# Patient Record
Sex: Female | Born: 1960
Health system: Southern US, Community
[De-identification: ages and names within clinical notes are randomized; demographics above are authoritative.]

## PROBLEM LIST (undated history)

## (undated) DIAGNOSIS — E7800 Pure hypercholesterolemia, unspecified: Secondary | ICD-10-CM

## (undated) DIAGNOSIS — E119 Type 2 diabetes mellitus without complications: Secondary | ICD-10-CM

## (undated) HISTORY — PX: DILATION AND CURETTAGE OF UTERUS: SHX78

## (undated) HISTORY — DX: Pure hypercholesterolemia, unspecified: E78.00

## (undated) HISTORY — DX: Type 2 diabetes mellitus without complications: E11.9

## (undated) HISTORY — PX: APPENDECTOMY: SHX54

---

## 1998-08-18 ENCOUNTER — Emergency Department (HOSPITAL_COMMUNITY): Admission: EM | Admit: 1998-08-18 | Discharge: 1998-08-19 | Payer: Self-pay | Admitting: Emergency Medicine

## 1998-08-18 ENCOUNTER — Encounter: Payer: Self-pay | Admitting: General Surgery

## 2000-12-30 ENCOUNTER — Other Ambulatory Visit: Admission: RE | Admit: 2000-12-30 | Discharge: 2000-12-30 | Payer: Self-pay | Admitting: Obstetrics and Gynecology

## 2002-01-05 ENCOUNTER — Other Ambulatory Visit: Admission: RE | Admit: 2002-01-05 | Discharge: 2002-01-05 | Payer: Self-pay | Admitting: Obstetrics and Gynecology

## 2003-01-25 ENCOUNTER — Other Ambulatory Visit: Admission: RE | Admit: 2003-01-25 | Discharge: 2003-01-25 | Payer: Self-pay | Admitting: Obstetrics and Gynecology

## 2003-02-21 ENCOUNTER — Ambulatory Visit (HOSPITAL_COMMUNITY): Admission: RE | Admit: 2003-02-21 | Discharge: 2003-02-21 | Payer: Self-pay | Admitting: Obstetrics and Gynecology

## 2003-02-21 ENCOUNTER — Encounter (INDEPENDENT_AMBULATORY_CARE_PROVIDER_SITE_OTHER): Payer: Self-pay

## 2004-02-02 ENCOUNTER — Other Ambulatory Visit: Admission: RE | Admit: 2004-02-02 | Discharge: 2004-02-02 | Payer: Self-pay | Admitting: Obstetrics and Gynecology

## 2005-01-30 ENCOUNTER — Other Ambulatory Visit: Admission: RE | Admit: 2005-01-30 | Discharge: 2005-01-30 | Payer: Self-pay | Admitting: Obstetrics and Gynecology

## 2005-05-31 ENCOUNTER — Encounter: Admission: RE | Admit: 2005-05-31 | Discharge: 2005-05-31 | Payer: Self-pay | Admitting: Obstetrics and Gynecology

## 2005-07-16 ENCOUNTER — Encounter: Admission: RE | Admit: 2005-07-16 | Discharge: 2005-07-16 | Payer: Self-pay | Admitting: Obstetrics and Gynecology

## 2006-01-28 ENCOUNTER — Encounter: Admission: RE | Admit: 2006-01-28 | Discharge: 2006-01-28 | Payer: Self-pay | Admitting: Obstetrics and Gynecology

## 2006-02-19 ENCOUNTER — Other Ambulatory Visit: Admission: RE | Admit: 2006-02-19 | Discharge: 2006-02-19 | Payer: Self-pay | Admitting: Obstetrics and Gynecology

## 2007-02-24 ENCOUNTER — Other Ambulatory Visit: Admission: RE | Admit: 2007-02-24 | Discharge: 2007-02-24 | Payer: Self-pay | Admitting: Obstetrics and Gynecology

## 2008-05-12 ENCOUNTER — Observation Stay (HOSPITAL_COMMUNITY): Admission: RE | Admit: 2008-05-12 | Discharge: 2008-05-13 | Payer: Self-pay | Admitting: General Surgery

## 2008-05-12 ENCOUNTER — Encounter (INDEPENDENT_AMBULATORY_CARE_PROVIDER_SITE_OTHER): Payer: Self-pay | Admitting: General Surgery

## 2008-05-12 ENCOUNTER — Ambulatory Visit (HOSPITAL_COMMUNITY): Admission: RE | Admit: 2008-05-12 | Discharge: 2008-05-12 | Payer: Self-pay | Admitting: Family Medicine

## 2010-07-01 ENCOUNTER — Encounter: Payer: Self-pay | Admitting: Obstetrics and Gynecology

## 2010-07-27 IMAGING — CT CT ABDOMEN WO/W CM
2 of 4 series · 13 of 32 positions shown, 18 images · IV contrast (Omnipaque 300)
Comparison: None

CT ABDOMEN

CLINICAL DATA: Right lower quadrant pain.  Nausea, vomiting.
Hematuria.  Evaluate for stones and appendicitis.

CT ABDOMEN AND PELVIS WITHOUT AND WITH CONTRAST
TECHNIQUE: Multidetector CT imaging of the abdomen and pelvis was
performed following the standard protocol before and following the
bolus administration of intravenous contrast.
Contrast: 100 ml 1mnipaque-F22

[Series 3: abd_pel_with 5.0 b40f · axial · 0.68mm/px · z∈[-415,-75]mm · 8 of 88 slices shown, 13 images]
[im 10/88  soft-tissue]
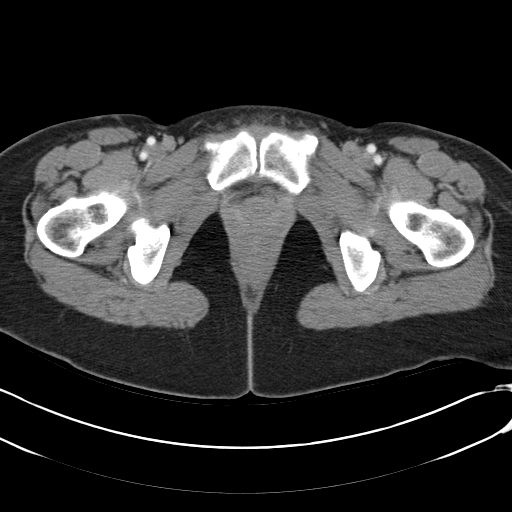
[im 10/88  bone]
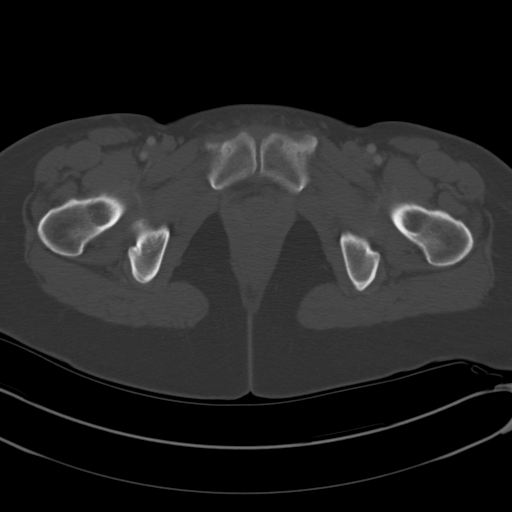
[im 20/88  soft-tissue]
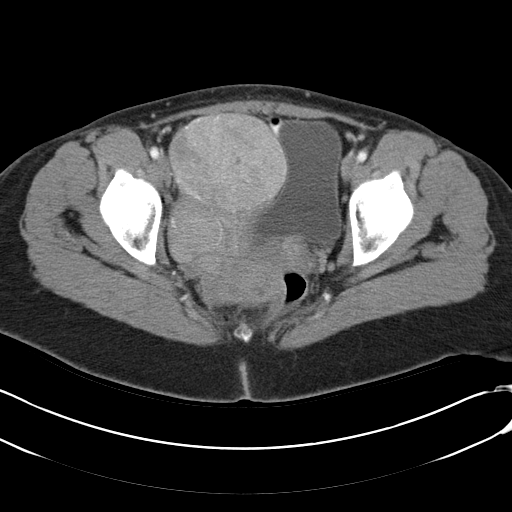
[im 30/88  soft-tissue]
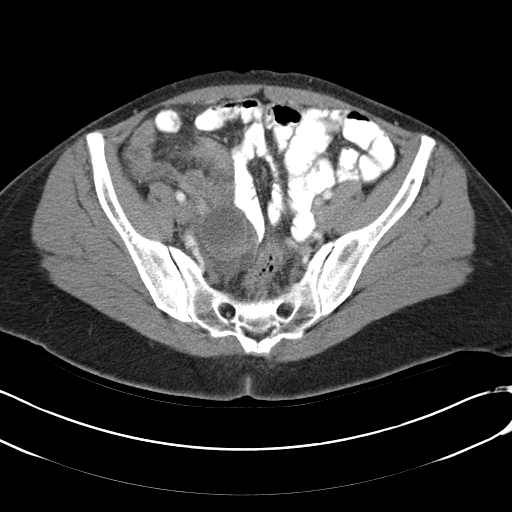
[im 39/88  soft-tissue]
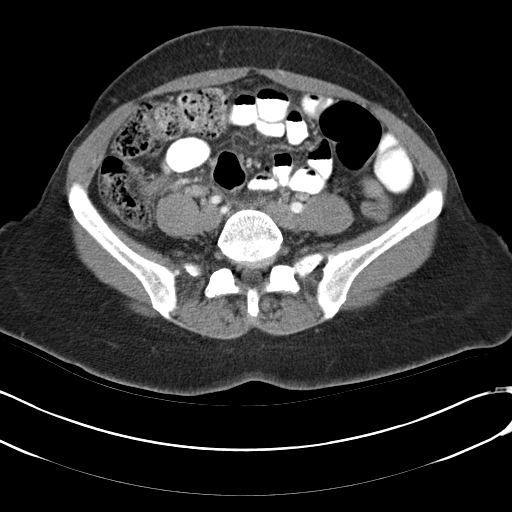
[im 49/88  soft-tissue]
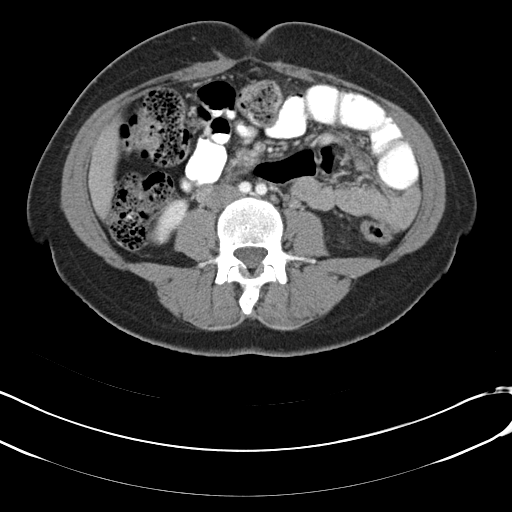
[im 49/88  lung]
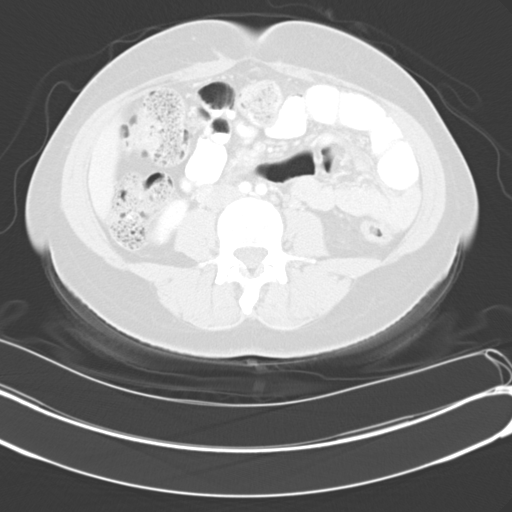
[im 59/88  soft-tissue]
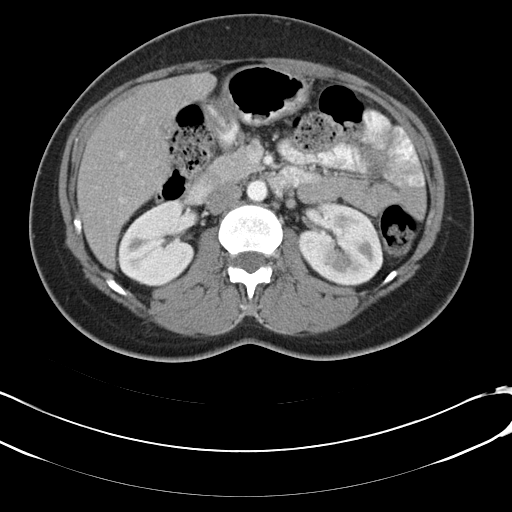
[im 59/88  lung]
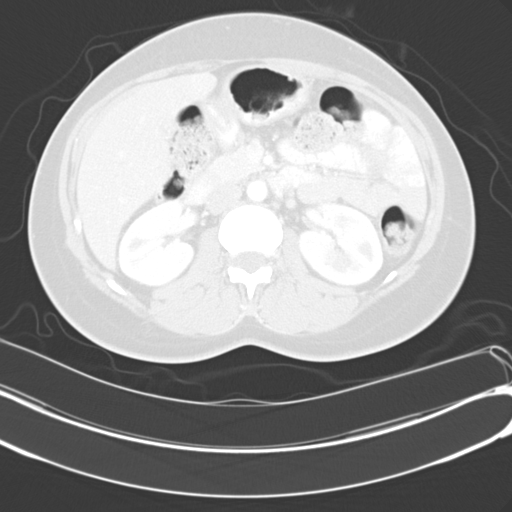
[im 68/88  soft-tissue]
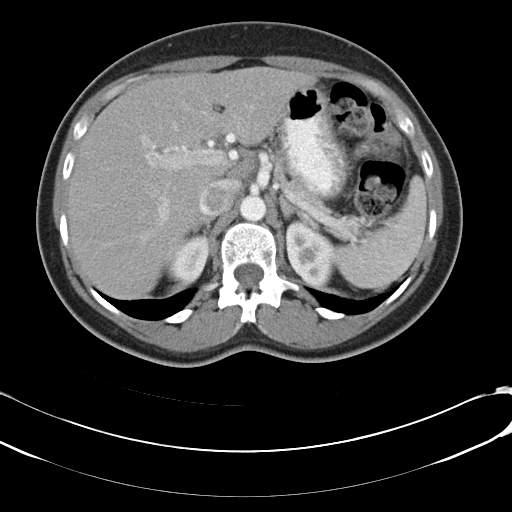
[im 68/88  lung]
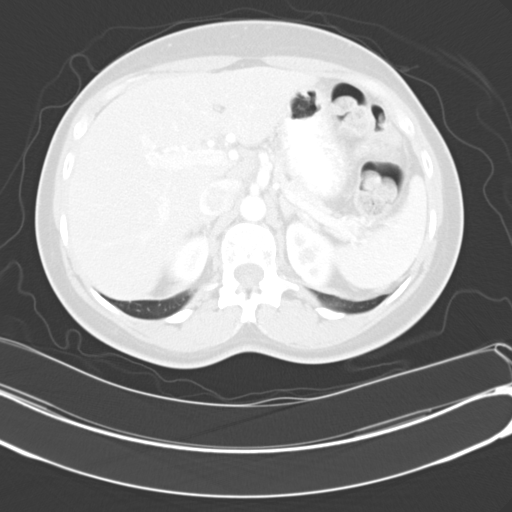
[im 78/88  soft-tissue]
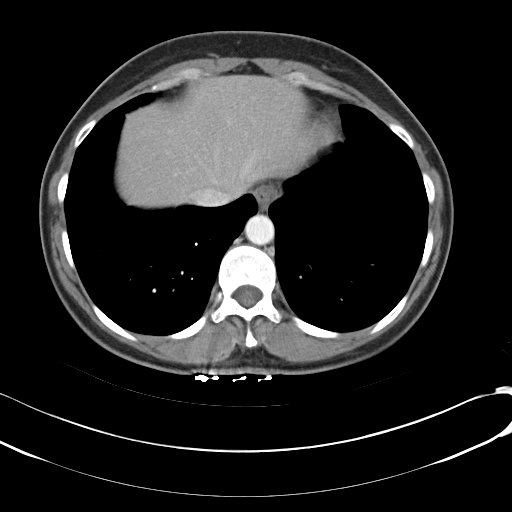
[im 78/88  lung]
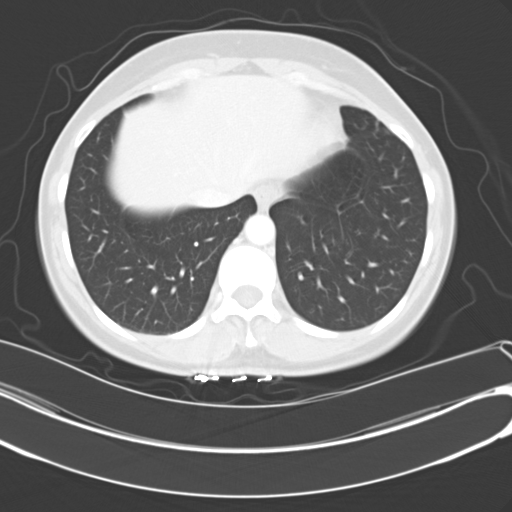

[Series 7: kidney delay 5.0 b40f · axial · delayed · 0.68mm/px · z∈[-415,-220]mm · 5 of 88 slices shown]
[im 10/88  soft-tissue]
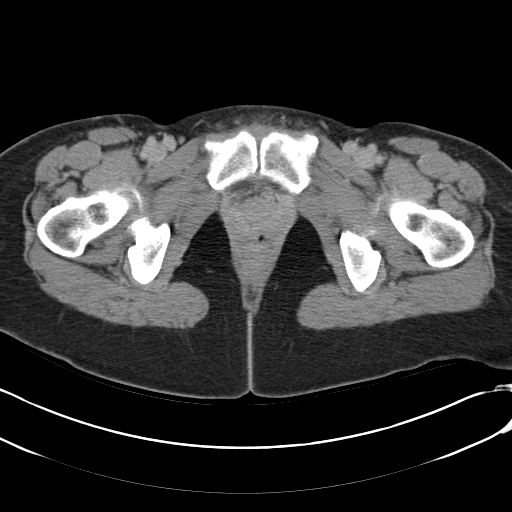
[im 20/88  soft-tissue]
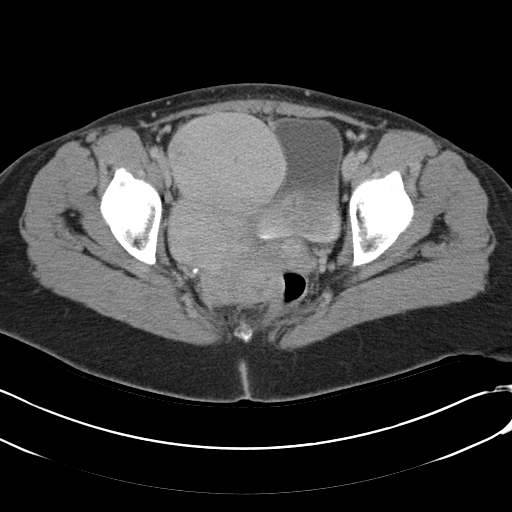
[im 30/88  soft-tissue]
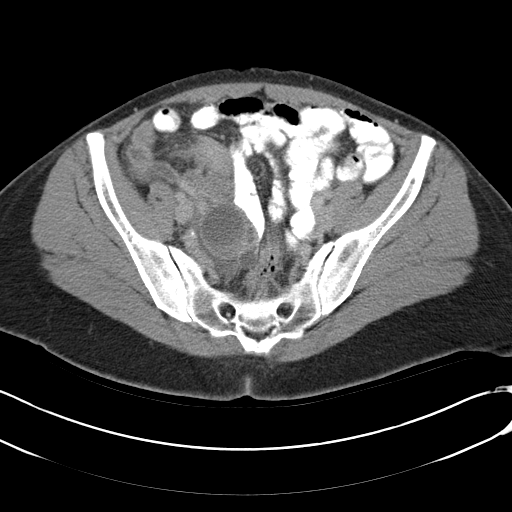
[im 39/88  soft-tissue]
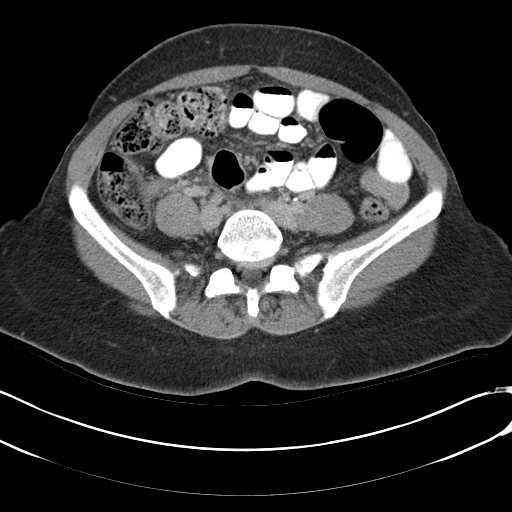
[im 49/88  soft-tissue]
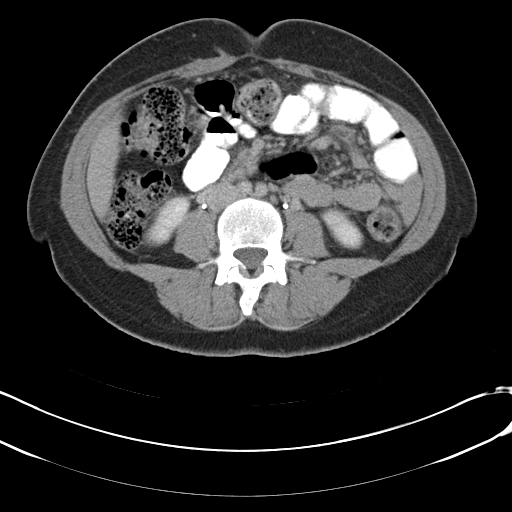

[13 of 32 positions shown; findings below may reference images not displayed]

FINDINGS: Images of the lung bases are unremarkable.  No intra
renal or ureteral calculi are identified.

Within the left hepatic lobe, a small cyst is 9 mm in diameter.  No
focal abnormalities identified within the spleen, pancreas, adrenal
glands, or kidneys.  Numerous calcified gallstones are identified
within a contracted gallbladder.
IMPRESSION: No evidence for acute abnormality in the abdomen.  See below for
description of the pelvis.

CT PELVIS
FINDINGS: Within the right lower quadrant, there is mesenteric
stranding.  The appendix appears thickened but is difficult to
differentiate from surrounding bowel loops.  Appendix likely
measures 11 mm on image 56.  Within the right lower quadrant, there
is a cystic structure measuring 3.8 cm on image 59.  I would favor
this representing an ovarian process.  Solid right adnexal and
uterine masses are 8.3 and 3.9 cm, likely representing a uterine
fibroids.  Given the complexity of the right lower quadrant
anatomy, the cause of the patient's pain is difficult to pinpoint.
The findings are suspicious for appendicitis however.  No left
adnexal mass is identified.  There is no pelvic adenopathy. Small
amount free pelvic fluid is present.
IMPRESSION: 1.  Right lower quadrant stranding and probable appendiceal
thickening.  Findings are suspicious for acute appendicitis.
2.  Uterine fibroids.
3.  Probable right ovarian cyst, 3.8 cm.

Critical test results telephoned to Edvig in the office of  Dr.
Dutch  at the time of interpretation on 05/12/2008 at [DATE] p.m.

## 2010-09-04 ENCOUNTER — Other Ambulatory Visit (HOSPITAL_COMMUNITY): Payer: Self-pay | Admitting: Internal Medicine

## 2010-09-04 DIAGNOSIS — Z139 Encounter for screening, unspecified: Secondary | ICD-10-CM

## 2010-09-06 ENCOUNTER — Ambulatory Visit (HOSPITAL_COMMUNITY)
Admission: RE | Admit: 2010-09-06 | Discharge: 2010-09-06 | Disposition: A | Discharge status: Home / Self Care | Payer: Self-pay | Source: Ambulatory Visit | Attending: Internal Medicine | Admitting: Internal Medicine

## 2010-09-06 DIAGNOSIS — Z1231 Encounter for screening mammogram for malignant neoplasm of breast: Secondary | ICD-10-CM | POA: Insufficient documentation

## 2010-09-06 DIAGNOSIS — Z139 Encounter for screening, unspecified: Secondary | ICD-10-CM

## 2010-10-23 NOTE — Op Note (Signed)
NAME:  Vicki Ward, Vicki Ward NO.:  [id number]   MEDICAL RECORD NO.:  [id number]          PATIENT TYPE:  OBV   LOCATION:  A334                          FACILITY:  [facility]   PHYSICIAN:  Tilford Pillar, MD      DATE OF BIRTH:  1960-10-26   DATE OF PROCEDURE:  05/12/2008  DATE OF DISCHARGE:                               OPERATIVE REPORT   PREOPERATIVE DIAGNOSIS:  Acute appendicitis.   POSTOPERATIVE DIAGNOSIS:  Acute appendicitis.   PROCEDURE:  Laparoscopic appendectomy.   SURGEON:  Tilford Pillar, MD.   ANESTHESIA:  General endotracheal local anesthetic with 0.5% Sensorcaine  plain.   SPECIMENS:  Appendix.   ESTIMATED BLOOD LOSS:  Minimal.   INDICATIONS:  The patient is a 50 year old female who is referred to my  office by her primary care physician for right lower quadrant abdominal  pain.  She had actually had extensive workup this morning by her primary  care physician with a CT of the abdomen and pelvis and blood work  already obtained.  Her symptoms have started approximately 2 days ago as  a periumbilical pain, with localization to right lower quadrant.  Her  physical exam and medical history were consistent with acute  appendicitis.  This was confirmed with a CT evaluation of the abdomen,  which demonstrated inflammatory changes around the appendix as well as  elevated white blood cell count.  She was therefore directly admitted to  [facility].  Risks, benefits, and alternatives of a  laparoscopic possible open appendectomy were discussed at length with  the patient.  The patient's questions and concerns were addressed and  the patient was consented for the planned procedure.   OPERATION:  The patient was taken to the operating room, placed in  supine position on the operating room table at which time the general  anesthetic was administered.  Once the patient was asleep, she was  endotracheally intubated by Anesthesia.  At this point, a Foley  catheter  was placed in a standard sterile fashion by the operating room staff and  her abdomen was prepped with DuraPrep solution.  Drapes were placed in  standard fashion.  A stab incision was created supraumbilically with 11-  blade scalpel.  Additional dissection down through the subcuticular  tissue was carried out using a Kocher clamp, which was utilized to grasp  the anterior abdominal wall fascia and move this anteriorly.  A Veress  needle was then inserted.  Saline drop test was utilized to confirm  intraperitoneal placement and then pneumoperitoneum was then initiated.  Once sufficient pneumoperitoneum was obtained, a 11-mm trocar was  inserted over the laparoscope allowing visualization of the trocar  entering into the peritoneal cavity.  At this time, the remaining  trocars were placed with a 5-mm trocar in the suprapubic region and an  11-mm trocar in the left lateral abdominal wall.  These were placed via  a stab incision and placement under direct visualization.  The patient  was then placed in a Trendelenburg left lateral decubitus position to  help with the exposure.  The  cecum was identified, __________ followed  down to the appendix which was clearly dilated and indurated.  This was  freed from surrounding tissue.  A [place] dissector was utilized to  create a window between the appendix and mesoappendix at the base of the  appendix that join the cecum.  A regular 35-mm Endo-GIA stapler was  utilized to divide the base of the appendix at the cecum and then 2  vascular reloads were utilized to divide the mesoappendix.  Once freed,  the appendix was placed in EndoCatch bag and was placed along the right  lateral abdominal wall.   At this point I examined the staple lines.  Hemostasis was excellent.  All staple lines were intact and I was quite pleased with the appearance  of the area.  Attention at this time was turned to closure.  Using a  Endoclose suture passing  device, a 2-0 Vicryl suture was passed through  both 11-mm trocar sites.  The appendix was then removed in an intact  EndoCatch bag through the umbilical trocar site.  The appendix was  placed in the back table and was sent as a permanent specimen to  pathology.  At this time, the pneumoperitoneum was evacuated.  The  Vicryl sutures were secured.  The local anesthetic was instilled.  A 4-0  Monocryl was utilized to reapproximate the skin edges at all 3 trocar  sites in a running subcuticular suture.  The skin was washed and dried  with moist and dry towel.  Benzoin was applied around the incision.  Half-inch Steri-Strips were placed.  The drains were removed.  The  patient was allowed to come out of general anesthetic and the patient  was transferred back to regular hospital bed.  She was transferred to  the postanesthetic care unit in stable condition.  At the conclusion of  the procedure, all instrument, sponge, and needle counts were correct.  The patient tolerated the procedure well.      Tilford Pillar, MD  Electronically Signed     BZ/MEDQ  D:  05/12/2008  T:  05/13/2008  Job:  [id number]   cc:   [facility]   Patrica Duel, M.D.  Fax: [phone]

## 2010-10-23 NOTE — H&P (Signed)
NAME:  Vicki Ward, Vicki Ward NO.:  [id number]   MEDICAL RECORD NO.:  [id number]          PATIENT TYPE:  OBV   LOCATION:  A334                          FACILITY:  [facility]   PHYSICIAN:  Tilford Pillar, MD      DATE OF BIRTH:  1961-01-25   DATE OF ADMISSION:  05/12/2008  DATE OF DISCHARGE:  LH                              HISTORY & PHYSICAL   ADMISSION DIAGNOSIS:  Acute appendicitis.   HISTORY OF PRESENT ILLNESS:  The patient is a 50 year old female, who  presented to my office by referral from her primary care physician for  right lower quadrant abdominal pain.  This apparently started  approximately 24-48 hours previously.  It started as a periumbilical  abdominal pain within the last 12 hours.  It is localized to the right  lower quadrant.  Pain is being constant and severe in the right lower  quadrant.  She has had no nausea, no emesis, no change in bowel  function, no hematochezia, no melena, and no change in urination.  She  has had no similar symptomatology in the past, and it is worse with  movement.  Based on the pain, she did present to Dr. Nobie Putnam, her  primary care physician.  Workup was obtained this morning including a CT  evaluation of the abdomen and pelvis as an outpatient as well as blood  rotation was then reevaluated at [facility], and the patient  was referred to my office.  At this time, the pain is continuous.  She  has had no sick contact or no recent exposures.   PAST MEDICAL HISTORY:  Diabetes mellitus type 2.   PAST SURGICAL HISTORY:  None.   MEDICATIONS:  Actos 15 mg p.o. daily.   ALLERGIES:  No known drug allergies.   SOCIAL HISTORY:  No tobacco.  She has had 2-3 beers per week,  utilization of alcohol, and no recreational drug use.  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   REVIEW OF SYSTEMS:  CONSTITUTIONAL:  Unremarkable.  EYES:  Unremarkable.  EARS, NOSE, AND THROAT:  Unremarkable.  RESPIRATORY:  Unremarkable.  CARDIOVASCULAR:  Unremarkable.  GASTROINTESTINAL:  As per HPI.  GENITOURINARY:  Unremarkable.  MUSCULOSKELETAL:  Unremarkable.  ENDOCRINE:  Unremarkable.  NEURO:  Unremarkable.  SKIN:  Unremarkable.   PHYSICAL EXAMINATION:  GENERAL:  The patient is an age appropriate  female, in no acute distress.  She is alert and oriented x3, again not  in any acute distress.  HEENT:  Scalp:  No deformities, no masses.  Pupils equal, round, and  reactive to light.  Extraocular movements are intact.  No scleral  icterus or conjunctival pallor is noted.  Oral mucosa is pink.  Normal  occlusion.  NECK:  Trachea is midline.  No cervical lymphadenopathy is apparent.  PULMONARY:  Unlabored respirations.  She is clear to auscultation  bilaterally.  No crackles.  No wheezing.  CARDIOVASCULAR:  Regular rate and rhythm.  No murmurs and no gallops are  appreciated in the office.  She has 2+  radial and dorsalis pedis pulses  bilaterally.  ABDOMINAL:  Abdomen is soft and flat.  She does have positive right  lower quadrant pain at this point.  She does have referred pain to this  area as well.  She has no masses.  She has no diffuse peritoneal signs.  No hernias are appreciated.  SKIN:  Warm and dry.   PERTINENT LABORATORY AND RADIOGRAPHIC STUDIES:  CBC, white blood cell  count 11,000.  CT of the abdomen and pelvis that shows no evidence of  any free air or free fluid.  There is a noted right ovarian cyst.  There  is also a pericecal and periappendiceal fat stranding as well as  dilatation of the appendix consistent with an acute appendicitis as per  radiology report.   ASSESSMENT AND PLAN:  Acute appendicitis.  Based on the patient's  physical exam, the history of present illness as well as of the  radiographic, and laboratory evaluation, I did discuss with the patient  the suspicion of diagnosis of acute appendicitis.  At this time, I do  recommend proceeding to the operating  room.  Risks, benefits, and  alternatives are discussed at length with the patient including, but not  limited to risk of bleeding, infection, staple line leak as well as a  possibility of intraoperative cardiac and pulmonary events.  At this  time, the patient will be kept in n.p.o. status.  IV fluids will be  administered upon presentation to a short-stay surgery.  The patient  understands that she will after observation will resume diabetic  management following her operation.  Perioperative antibiotics will be  administered.  Deep vein thrombosis prophylaxis will be maintained with  Lovenox subcutaneous injection,  bilateral thigh high TedHose, and  bilateral SCDs.      Tilford Pillar, MD  Electronically Signed     BZ/MEDQ  D:  05/12/2008  T:  05/13/2008  Job:  [id number]

## 2010-10-26 NOTE — H&P (Signed)
   NAME:  Vicki Ward, Vicki Ward                            ACCOUNT NO.:  [id number]   MEDICAL RECORD NO.:  [id number]                   PATIENT TYPE:  AMB   LOCATION:  SDC                                  FACILITY:  [facility]   PHYSICIAN:  James A. Ashley Royalty, M.D.             DATE OF BIRTH:  1960-11-04   DATE OF ADMISSION:  DATE OF DISCHARGE:                                HISTORY & PHYSICAL   HISTORY OF PRESENT ILLNESS:  This is a 50 year old gravida 1 para 1 with a  known fibroid uterus who presented to me January 25, 2003 complaining of  intermenstrual bleeding of two months duration.  She has recently been on  Ortho Tri-Cyclen but ran out last month.  She is for diagnostic/operative  hysteroscopy and dilatation and curettage.   MEDICATIONS:  As above.   PAST MEDICAL HISTORY:  1. Medical:  Negative.  2. Surgical:  Negative.   ALLERGIES:  Negative.   FAMILY HISTORY:  Positive for diabetes.   SOCIAL HISTORY:  The patient denies using tobacco or significant alcohol.   REVIEW OF SYSTEMS:  Noncontributory.   PHYSICAL EXAMINATION:  GENERAL:  Well-developed, well-nourished, pleasant  black female in no acute distress.  VITAL SIGNS:  Afebrile, vital signs stable.  SKIN:  Warm and dry without lesions.  LYMPH:  There is no supraclavicular, cervical, or inguina adenopathy.  HEENT:  Normocephalic.  NECK:  Supple without thyromegaly.  CHEST:  Lungs are clear.  CARDIAC:  Regular rate and rhythm without murmurs, gallops, or rubs.  BREASTS:  Soft and without palpable mass, discharge, retraction, or  adenopathy.  ABDOMEN:  Soft and nontender without masses or organomegaly.  Bowel sounds  are active.  MUSCULOSKELETAL:  Reveals full range of motion without edema, cyanosis, or  CVA tenderness.  PELVIC:  External genitalia is within normal limits.  The vagina and cervix  are without gross lesions.  Bimanual examination revealed the uterus to be  approximately 9 x 5 x 4 cm, slightly nodular.  I could  appreciate no adnexal  masses.   IMPRESSION:  1. Fibroid uterus.  2. Abnormal uterine bleeding.   PLAN:  1. Diagnostic/operative hysteroscopy.  2. Dilatation and curettage.   The risks, benefits, complications, and alternatives were fully discussed  with the patient.  She states she understands and accepts.  Questions  invited and answered.                                               James A. Ashley Royalty, M.D.    JAM/MEDQ  D:  02/21/2003  T:  02/21/2003  Job:  [id number]   cc:   Outpatient Surgery at 12:30 p.m.

## 2010-10-26 NOTE — Op Note (Signed)
NAME:  Vicki Ward, Vicki Ward                            ACCOUNT NO.:  [id number]   MEDICAL RECORD NO.:  [id number]                   PATIENT TYPE:  AMB   LOCATION:  SDC                                  FACILITY:  [facility]   PHYSICIAN:  James A. Ashley Royalty, M.D.             DATE OF BIRTH:  February 03, 1961   DATE OF PROCEDURE:  02/21/2003  DATE OF DISCHARGE:                                 OPERATIVE REPORT   PREOPERATIVE DIAGNOSES:  1. Abnormal uterine bleeding.  2. Fibroid uterus.   POSTOPERATIVE DIAGNOSES:  1. Abnormal uterine bleeding.  2. Possible endometrial polyp.  Pathology pending.   PROCEDURE:  1. Diagnostic/operative hysteroscopy with polypectomy.  2. Dilatation and curettage.   SURGEON:  Rudy Jew. Ashley Royalty, M.D.   ANESTHESIA:  General anesthesia.   ESTIMATED BLOOD LOSS:  Less than 25 mL.   COMPLICATIONS:  None.   PACKS AND DRAINS:  None.   DESCRIPTION OF PROCEDURE:  The patient was taken to the operating room and  placed in the dorsal supine position.  After adequate general anesthesia was  administered, she was placed in the lithotomy position, prepped and draped  in the usual manner for vaginal surgery.  A posterior weighted retractor was  placed in the vagina.  The anterior lip of the cervix was grasped with a  single-tooth tenaculum.  The uterus was gently sounded to two and three  quarters inches and noted to be anteverted.  The cervix was then serially  dilated to a size 29 [place] using [employer] dilators.  The diagnostic  hysteroscope was placed inside the uterine cavity and visualization was  noted to be poor.  Hence a resectoscope was placed.  Sorbitol was used as a  distention median throughout.  The left and right tubal ostia were  visualized without difficulty.  There appeared to be an anterior polyp  emanating from the anterior aspect of the uterine fundus.  This was excised  sharply and submitted to pathology for histologic studies.  The operator  could not be sure  whether it was in fact a polyp or perhaps some partially  disrupted fragment of endometrium.  At any rate, it was submitted separately  to pathology for histologic studies.   Next, attention was turned to the uterine curettage.  A medium sized curette  was introduced into the uterine cavity.  A four quadrant technique was  employed in order to obtain diagnostic curettage.  Then a therapeutic  curettage was performed.  The curettings were submitted in their entirety to  pathology for histologic studies separately.   At this point, the patient was felt to have benefitted maximally from the  surgical procedure.  The vaginal instruments were removed.  Small cervical  lacerations on the tenaculum were easily repaired with 3-0 chromic in  interrupted fashion.  Hemostasis was noted.  The patient was then taken to  the recovery room in excellent condition.  James A. Ashley Royalty, M.D.    JAM/MEDQ  D:  02/21/2003  T:  02/21/2003  Job:  [id number]

## 2011-03-15 LAB — BASIC METABOLIC PANEL
Chloride: 101 mEq/L (ref 96–112)
Potassium: 3.9 mEq/L (ref 3.5–5.1)

## 2011-03-15 LAB — CBC
HCT: 38.8 % (ref 36.0–46.0)
Hemoglobin: 13.1 g/dL (ref 12.0–15.0)
Platelets: 287 10*3/uL (ref 150–400)
RBC: 4.12 MIL/uL (ref 3.87–5.11)
WBC: 11 10*3/uL — ABNORMAL HIGH (ref 4.0–10.5)

## 2011-03-15 LAB — GLUCOSE, CAPILLARY
Glucose-Capillary: 165 mg/dL — ABNORMAL HIGH (ref 70–99)
Glucose-Capillary: 177 mg/dL — ABNORMAL HIGH (ref 70–99)

## 2011-03-25 ENCOUNTER — Ambulatory Visit (HOSPITAL_COMMUNITY)
Admission: RE | Admit: 2011-03-25 | Discharge: 2011-03-25 | Disposition: A | Discharge status: Home / Self Care | Payer: 59 | Source: Ambulatory Visit | Attending: Family Medicine | Admitting: Family Medicine

## 2011-03-25 ENCOUNTER — Other Ambulatory Visit (HOSPITAL_COMMUNITY): Payer: Self-pay | Admitting: Family Medicine

## 2011-03-25 DIAGNOSIS — T148XXA Other injury of unspecified body region, initial encounter: Secondary | ICD-10-CM

## 2011-03-25 DIAGNOSIS — M25579 Pain in unspecified ankle and joints of unspecified foot: Secondary | ICD-10-CM | POA: Insufficient documentation

## 2011-03-25 DIAGNOSIS — E119 Type 2 diabetes mellitus without complications: Secondary | ICD-10-CM

## 2012-07-20 ENCOUNTER — Other Ambulatory Visit (HOSPITAL_COMMUNITY): Payer: Self-pay | Admitting: Internal Medicine

## 2012-07-20 DIAGNOSIS — Z139 Encounter for screening, unspecified: Secondary | ICD-10-CM

## 2012-07-27 ENCOUNTER — Ambulatory Visit (HOSPITAL_COMMUNITY)
Admission: RE | Admit: 2012-07-27 | Discharge: 2012-07-27 | Disposition: A | Discharge status: Home / Self Care | Payer: 59 | Source: Ambulatory Visit | Attending: Internal Medicine | Admitting: Internal Medicine

## 2012-07-27 DIAGNOSIS — Z1231 Encounter for screening mammogram for malignant neoplasm of breast: Secondary | ICD-10-CM | POA: Insufficient documentation

## 2012-07-27 DIAGNOSIS — Z139 Encounter for screening, unspecified: Secondary | ICD-10-CM

## 2012-07-30 ENCOUNTER — Telehealth: Payer: Self-pay

## 2012-07-30 NOTE — Telephone Encounter (Signed)
Pt was referred by Dr. Regino Schultze for screening colonoscopy. Called, could not leave message.

## 2012-08-04 NOTE — Telephone Encounter (Signed)
Called and could not leave a message. Mailing a second letter.

## 2012-08-20 ENCOUNTER — Telehealth: Payer: Self-pay

## 2012-08-20 NOTE — Telephone Encounter (Signed)
Pt called to schedule colonoscopy. Her sister was recently diagnosed with rectal cancer. OV with AS on 09/08/2012 at 2:30 PM.

## 2012-09-08 ENCOUNTER — Ambulatory Visit (INDEPENDENT_AMBULATORY_CARE_PROVIDER_SITE_OTHER): Payer: 59 | Admitting: Gastroenterology

## 2012-09-08 ENCOUNTER — Encounter: Payer: Self-pay | Admitting: Gastroenterology

## 2012-09-08 VITALS — BP 121/68 | HR 79 | Temp 98.3°F | Ht 67.5 in | Wt 154.4 lb

## 2012-09-08 DIAGNOSIS — Z800 Family history of malignant neoplasm of digestive organs: Secondary | ICD-10-CM

## 2012-09-08 HISTORY — DX: Family history of malignant neoplasm of digestive organs: Z80.0

## 2012-09-08 MED ORDER — PEG 3350-KCL-NA BICARB-NACL 420 G PO SOLR: 4000.0000 mL | ORAL | Status: DC

## 2012-09-08 NOTE — Progress Notes (Signed)
CC PCP 

## 2012-09-08 NOTE — Patient Instructions (Addendum)
We have set you up for a colonoscopy with Dr. Jena Gauss in the near future.   Do not take any diabetes medications the morning of the procedure.

## 2012-09-08 NOTE — Assessment & Plan Note (Signed)
52 year old African-American female with no prior colonoscopy, presenting for initial screening. Notably, her sister was recently diagnosed with rectal cancer in her early 49s. Vicki Ward denies any abdominal pain, rectal bleeding, or change in bowel habits. She has no upper GI symptoms.  Proceed with TCS with Dr. Jena Gauss in near future: the risks, benefits, and alternatives have been discussed with the patient in detail. The patient states understanding and desires to proceed.

## 2012-09-08 NOTE — Progress Notes (Signed)
Primary Care Physician:  FUSCO,LAWRENCE J., MD Primary Gastroenterologist:  Dr. Rourk  Chief Complaint  Patient presents with  . Colonoscopy    HPI:   Vicki Ward is a 52-year-old female who presents today at the request of Dr. McGough for screening colonoscopy. Denies any constipation, diarrhea, rectal bleeding. No lack of appetite, thinks she may have lost some weight due to a busy work schedule. Occasional reflux but rare. No dysphagia. Her sister was recently diagnosed with rectal cancer.   Past Medical History  Diagnosis Date  . Diabetes   . Hypercholesteremia     Past Surgical History  Procedure Laterality Date  . Appendectomy    . Dilation and curettage of uterus      Current Outpatient Prescriptions  Medication Sig Dispense Refill  . aspirin 81 MG tablet Take 81 mg by mouth daily.      . Calcium Carbonate-Vitamin D (CALCIUM + D PO) Take 600 mg by mouth daily.      . enalapril (VASOTEC) 5 MG tablet Take 5 mg by mouth daily.      . linagliptin (TRADJENTA) 5 MG TABS tablet Take 5 mg by mouth daily.      . metformin (FORTAMET) 1000 MG (OSM) 24 hr tablet Take 1,000 mg by mouth daily with breakfast.      . simvastatin (ZOCOR) 20 MG tablet Take 20 mg by mouth every evening.       No current facility-administered medications for this visit.    Allergies as of 09/08/2012  . (No Known Allergies)    Family History  Problem Relation Age of Onset  . Rectal cancer Sister     age at onset 53, recently diagnosed     History   Social History  . Marital Status: Divorced    Spouse Name: N/A    Number of Children: N/A  . Years of Education: N/A   Occupational History  . [employer]    Social History Main Topics  . Smoking status: Never Smoker   . Smokeless tobacco: Not on file  . Alcohol Use: Yes     Comment: socially  . Drug Use: No  . Sexually Active: Not on file   Other Topics Concern  . Not on file   Social History Narrative  . No  narrative on file    Review of Systems: Gen: SEE HPI CV: Denies chest pain, heart palpitations, peripheral edema, syncope.  Resp: Denies shortness of breath at rest or with exertion. Denies wheezing or cough.  GI: SEE HPI GU : Denies urinary burning, urinary frequency, urinary hesitancy MS: +joint pain Derm: Denies rash, itching, dry skin Psych: Denies depression, anxiety, memory loss, and confusion Heme: Denies bruising, bleeding, and enlarged lymph nodes.  Physical Exam: BP 121/68  Pulse 79  Temp(Src) 98.3 F (36.8 C) (Oral)  Ht 5' 7.5" (1.715 m)  Wt 154 lb 6.4 oz (70.035 kg)  BMI 23.81 kg/m2 General:   Alert and oriented. Pleasant and cooperative. Well-nourished and well-developed. Appears younger than stated age.  Head:  Normocephalic and atraumatic. Eyes:  Without icterus, sclera clear and conjunctiva pink.  Ears:  Normal auditory acuity. Nose:  No deformity, discharge,  or lesions. Mouth:  No deformity or lesions, oral mucosa pink.  Neck:  Supple, without mass or thyromegaly. Lungs:  Clear to auscultation bilaterally. No wheezes, rales, or rhonchi. No distress.  Heart:  S1, S2 present without murmurs appreciated.  Abdomen:  +BS, soft, non-tender and non-distended. No HSM noted.   No guarding or rebound. No masses appreciated.  Rectal:  Deferred  Msk:  Symmetrical without gross deformities. Normal posture. Extremities:  Without clubbing or edema. Neurologic:  Alert and  oriented x4;  grossly normal neurologically. Skin:  Intact without significant lesions or rashes. Cervical Nodes:  No significant cervical adenopathy. Psych:  Alert and cooperative. Normal mood and affect.    

## 2012-10-01 ENCOUNTER — Ambulatory Visit (HOSPITAL_COMMUNITY)
Admission: RE | Admit: 2012-10-01 | Discharge: 2012-10-01 | Disposition: A | Discharge status: Home / Self Care | Payer: 59 | Source: Ambulatory Visit | Attending: Internal Medicine | Admitting: Internal Medicine

## 2012-10-01 ENCOUNTER — Encounter (HOSPITAL_COMMUNITY): Payer: Self-pay | Admitting: *Deleted

## 2012-10-01 ENCOUNTER — Encounter (HOSPITAL_COMMUNITY)
Admission: RE | Disposition: A | Discharge status: Home / Self Care | Payer: Self-pay | Source: Ambulatory Visit | Attending: Internal Medicine

## 2012-10-01 DIAGNOSIS — D126 Benign neoplasm of colon, unspecified: Secondary | ICD-10-CM | POA: Insufficient documentation

## 2012-10-01 DIAGNOSIS — Z01812 Encounter for preprocedural laboratory examination: Secondary | ICD-10-CM | POA: Insufficient documentation

## 2012-10-01 DIAGNOSIS — Z800 Family history of malignant neoplasm of digestive organs: Secondary | ICD-10-CM

## 2012-10-01 DIAGNOSIS — E119 Type 2 diabetes mellitus without complications: Secondary | ICD-10-CM | POA: Insufficient documentation

## 2012-10-01 DIAGNOSIS — Z1211 Encounter for screening for malignant neoplasm of colon: Secondary | ICD-10-CM | POA: Insufficient documentation

## 2012-10-01 DIAGNOSIS — Z860100 Personal history of colon polyps, unspecified: Secondary | ICD-10-CM | POA: Insufficient documentation

## 2012-10-01 DIAGNOSIS — K5730 Diverticulosis of large intestine without perforation or abscess without bleeding: Secondary | ICD-10-CM | POA: Insufficient documentation

## 2012-10-01 HISTORY — PX: COLONOSCOPY: SHX5424

## 2012-10-01 LAB — GLUCOSE, CAPILLARY: Glucose-Capillary: 167 mg/dL — ABNORMAL HIGH (ref 70–99)

## 2012-10-01 SURGERY — COLONOSCOPY
Anesthesia: Moderate Sedation

## 2012-10-01 MED ORDER — SODIUM CHLORIDE 0.9 % IV SOLN
INTRAVENOUS | Status: DC
  Administered 2012-10-01: 1000 mL via INTRAVENOUS

## 2012-10-01 MED ORDER — MEPERIDINE HCL 100 MG/ML IJ SOLN
INTRAMUSCULAR | Status: AC
  Filled 2012-10-01: qty 1

## 2012-10-01 MED ORDER — MIDAZOLAM HCL 5 MG/5ML IJ SOLN
INTRAMUSCULAR | Status: DC | PRN
  Administered 2012-10-01: 1 mg via INTRAVENOUS
  Administered 2012-10-01 (×2): 2 mg via INTRAVENOUS

## 2012-10-01 MED ORDER — MEPERIDINE HCL 100 MG/ML IJ SOLN
INTRAMUSCULAR | Status: DC | PRN
  Administered 2012-10-01: 25 mg via INTRAVENOUS
  Administered 2012-10-01: 50 mg via INTRAVENOUS

## 2012-10-01 MED ORDER — ONDANSETRON HCL 4 MG/2ML IJ SOLN
INTRAMUSCULAR | Status: AC
  Filled 2012-10-01: qty 2

## 2012-10-01 MED ORDER — STERILE WATER FOR IRRIGATION IR SOLN
Status: DC | PRN
  Administered 2012-10-01: 12:00:00

## 2012-10-01 MED ORDER — ONDANSETRON HCL 4 MG/2ML IJ SOLN
INTRAMUSCULAR | Status: DC | PRN
  Administered 2012-10-01: 4 mg via INTRAVENOUS

## 2012-10-01 MED ORDER — MIDAZOLAM HCL 5 MG/5ML IJ SOLN
INTRAMUSCULAR | Status: AC
  Filled 2012-10-01: qty 10

## 2012-10-01 NOTE — H&P (View-Only) (Signed)
Primary Care Physician:  Cassell Smiles., MD Primary Gastroenterologist:  Dr. Jena Gauss  Chief Complaint  Patient presents with  . Colonoscopy    HPI:   Vicki Ward is a 52 year old female who presents today at the request of Dr. Regino Schultze for screening colonoscopy. Denies any constipation, diarrhea, rectal bleeding. No lack of appetite, thinks she may have lost some weight due to a busy work schedule. Occasional reflux but rare. No dysphagia. Her sister was recently diagnosed with rectal cancer.   Past Medical History  Diagnosis Date  . Diabetes   . Hypercholesteremia     Past Surgical History  Procedure Laterality Date  . Appendectomy    . Dilation and curettage of uterus      Current Outpatient Prescriptions  Medication Sig Dispense Refill  . aspirin 81 MG tablet Take 81 mg by mouth daily.      . Calcium Carbonate-Vitamin D (CALCIUM + D PO) Take 600 mg by mouth daily.      . enalapril (VASOTEC) 5 MG tablet Take 5 mg by mouth daily.      Marland Kitchen linagliptin (TRADJENTA) 5 MG TABS tablet Take 5 mg by mouth daily.      . metformin (FORTAMET) 1000 MG (OSM) 24 hr tablet Take 1,000 mg by mouth daily with breakfast.      . simvastatin (ZOCOR) 20 MG tablet Take 20 mg by mouth every evening.       No current facility-administered medications for this visit.    Allergies as of 09/08/2012  . (No Known Allergies)    Family History  Problem Relation Age of Onset  . Rectal cancer Sister     age at onset 74, recently diagnosed     History   Social History  . Marital Status: Divorced    Spouse Name: N/A    Number of Children: N/A  . Years of Education: N/A   Occupational History  . [employer]    Social History Main Topics  . Smoking status: Never Smoker   . Smokeless tobacco: Not on file  . Alcohol Use: Yes     Comment: socially  . Drug Use: No  . Sexually Active: Not on file   Other Topics Concern  . Not on file   Social History Narrative  . No  narrative on file    Review of Systems: Gen: SEE HPI CV: Denies chest pain, heart palpitations, peripheral edema, syncope.  Resp: Denies shortness of breath at rest or with exertion. Denies wheezing or cough.  GI: SEE HPI GU : Denies urinary burning, urinary frequency, urinary hesitancy MS: +joint pain Derm: Denies rash, itching, dry skin Psych: Denies depression, anxiety, memory loss, and confusion Heme: Denies bruising, bleeding, and enlarged lymph nodes.  Physical Exam: BP 121/68  Pulse 79  Temp(Src) 98.3 F (36.8 C) (Oral)  Ht 5' 7.5" (1.715 m)  Wt 154 lb 6.4 oz (70.035 kg)  BMI 23.81 kg/m2 General:   Alert and oriented. Pleasant and cooperative. Well-nourished and well-developed. Appears younger than stated age.  Head:  Normocephalic and atraumatic. Eyes:  Without icterus, sclera clear and conjunctiva pink.  Ears:  Normal auditory acuity. Nose:  No deformity, discharge,  or lesions. Mouth:  No deformity or lesions, oral mucosa pink.  Neck:  Supple, without mass or thyromegaly. Lungs:  Clear to auscultation bilaterally. No wheezes, rales, or rhonchi. No distress.  Heart:  S1, S2 present without murmurs appreciated.  Abdomen:  +BS, soft, non-tender and non-distended. No HSM noted.  No guarding or rebound. No masses appreciated.  Rectal:  Deferred  Msk:  Symmetrical without gross deformities. Normal posture. Extremities:  Without clubbing or edema. Neurologic:  Alert and  oriented x4;  grossly normal neurologically. Skin:  Intact without significant lesions or rashes. Cervical Nodes:  No significant cervical adenopathy. Psych:  Alert and cooperative. Normal mood and affect.

## 2012-10-01 NOTE — Interval H&P Note (Signed)
History and Physical Interval Note:  10/01/2012 12:25 PM  Vicki Ward  has presented today for surgery, with the diagnosis of FAMILY HISTORY OF COLON CANCER  The various methods of treatment have been discussed with the patient and family. After consideration of risks, benefits and other options for treatment, the patient has consented to  Procedure(s) with comments: COLONOSCOPY (N/A) - 11:00 as a surgical intervention .  The patient's history has been reviewed, patient examined, no change in status, stable for surgery.  I have reviewed the patient's chart and labs.  Questions were answered to the patient's satisfaction.     Colonoscopy per plan.The risks, benefits, limitations, alternatives and imponderables have been reviewed with the patient. Questions have been answered. All parties are agreeable.    Eula Listen

## 2012-10-01 NOTE — Op Note (Signed)
[facility] [address] [place], [address]   COLONOSCOPY PROCEDURE REPORT  PATIENT: Vicki Ward, Vicki Ward  MR#:         [id number] BIRTHDATE: 11-15-1960 , 51  yrs. old GENDER: Female ENDOSCOPIST: R.  Roetta Sessions, MD FACP FACG REFERRED BY:  Artis Delay, M.D. PROCEDURE DATE:  10/01/2012 PROCEDURE:     Colonoscopy with biopsy  INDICATIONS:  First-ever high-risk screening colonoscopy  INFORMED CONSENT:  The risks, benefits, alternatives and imponderables including but not limited to bleeding, perforation as well as the possibility of a missed lesion have been reviewed.  The potential for biopsy, lesion removal, etc. have also been discussed.  Questions have been answered.  All parties agreeable. Please see the history and physical in the medical record for more information.  MEDICATIONS: Versed 5 mg IV and Demerol 75 mg IV Cetacaine spray. Zofran 4 mg  DESCRIPTION OF PROCEDURE:  After a digital rectal exam was performed, the EC-[id number] ([id number])  colonoscope was advanced from the anus through the rectum and colon to the area of the cecum, ileocecal valve and appendiceal orifice.  The cecum was deeply intubated.  These structures were well-seen and photographed for the record.  From the level of the cecum and ileocecal valve, the scope was slowly and cautiously withdrawn.  The mucosal surfaces were carefully surveyed utilizing scope tip deflection to facilitate fold flattening as needed.  The scope was pulled down into the rectum where a thorough examination including retroflexion was performed.    FINDINGS:  Adequate preparation. Normal rectum. (1) 2 mm area of polypoid mucosa at the base of the cecum; otherwise, the remainder of colonic mucosa appeared entirely normal except for a single, solitary ascending colon diverticulum.  THERAPEUTIC / DIAGNOSTIC MANEUVERS PERFORMED:  The above-mentioned polyp was cold biopsied/removed  COMPLICATIONS: none  CECAL  WITHDRAWAL TIME:  9 minutes  IMPRESSION:  Single, tiny cecal polyp-removed as described above. Single ascending diverticulum  RECOMMENDATIONS:   Follow up on pathology. Further recommendations to follow   _______________________________ eSigned:  R. Roetta Sessions, MD FACP [facility] 10/01/2012 1:16 PM   CC:    PATIENT NAME:  Vanna, Sailer MR#: [id number]

## 2012-10-02 ENCOUNTER — Encounter: Payer: Self-pay | Admitting: Internal Medicine

## 2012-10-05 ENCOUNTER — Encounter (HOSPITAL_COMMUNITY): Payer: Self-pay | Admitting: Internal Medicine

## 2012-11-09 ENCOUNTER — Ambulatory Visit (INDEPENDENT_AMBULATORY_CARE_PROVIDER_SITE_OTHER): Payer: 59 | Admitting: Emergency Medicine

## 2012-11-09 VITALS — BP 115/71 | HR 82 | Temp 98.3°F | Resp 16 | Ht 67.5 in | Wt 152.0 lb

## 2012-11-09 DIAGNOSIS — S335XXA Sprain of ligaments of lumbar spine, initial encounter: Secondary | ICD-10-CM

## 2012-11-09 MED ORDER — NAPROXEN SODIUM 550 MG PO TABS: 550.0000 mg | ORAL_TABLET | Freq: Two times a day (BID) | ORAL | Status: AC

## 2012-11-09 MED ORDER — HYDROCODONE-ACETAMINOPHEN 5-325 MG PO TABS: 1.0000 | ORAL_TABLET | ORAL | Status: DC | PRN

## 2012-11-09 MED ORDER — CYCLOBENZAPRINE HCL 10 MG PO TABS: 10.0000 mg | ORAL_TABLET | Freq: Three times a day (TID) | ORAL | Status: DC | PRN

## 2012-11-09 NOTE — Patient Instructions (Addendum)

## 2012-11-09 NOTE — Progress Notes (Signed)
[facility] [address], [place] [address] [phone]  Date:  11/09/2012   Name:  Vicki Ward   DOB:  02-12-1961   MRN:  [id number]  PCP:  Cassell Smiles., MD    Chief Complaint: Back Pain   History of Present Illness:  Vicki Ward is a 52 y.o. very pleasant female patient who presents with the following:  No history of injury.  Says that she had low back pain started on Tuesday.  Not so much of a problem for the rest of the week until Saturday when her pain dramatically increased and she developed pain in the back that has interfered with her normal activity starting today.  She has no radiation of pain and no neurologic symptoms.  No improvement with over the counter medications or other home remedies. Denies other complaint or health concern today.   Patient Active Problem List   Diagnosis Date Noted  . Family history of colon cancer requiring screening colonoscopy 09/08/2012    Past Medical History  Diagnosis Date  . Diabetes   . Hypercholesteremia     Past Surgical History  Procedure Laterality Date  . Appendectomy    . Dilation and curettage of uterus    . Colonoscopy N/A 10/01/2012    Procedure: COLONOSCOPY;  Surgeon: Corbin Ade, MD;  [facility];  Service: Endoscopy;  Laterality: N/A;  11:00    History  Substance Use Topics  . Smoking status: Never Smoker   . Smokeless tobacco: Not on file  . Alcohol Use: Yes     Comment: socially    Family History  Problem Relation Age of Onset  . Rectal cancer Sister     age at onset 51, recently diagnosed   . Diabetes type I Mother     No Known Allergies  Medication list has been reviewed and updated.  Current Outpatient Prescriptions on File Prior to Visit  Medication Sig Dispense Refill  . aspirin 81 MG tablet Take 81 mg by mouth daily.      . Calcium Carbonate-Vitamin D (CALCIUM + D PO) Take 600 mg by mouth daily.      . enalapril (VASOTEC) 5 MG tablet Take  5 mg by mouth daily.      Marland Kitchen linagliptin (TRADJENTA) 5 MG TABS tablet Take 5 mg by mouth daily.      . metformin (FORTAMET) 1000 MG (OSM) 24 hr tablet Take 1,000 mg by mouth daily with breakfast.      . simvastatin (ZOCOR) 20 MG tablet Take 20 mg by mouth every evening.       No current facility-administered medications on file prior to visit.    Review of Systems:  As per HPI, otherwise negative.    Physical Examination: Filed Vitals:   11/09/12 2021  BP: 115/71  Pulse: 82  Temp: 98.3 F (36.8 C)  Resp: 16   Filed Vitals:   11/09/12 2021  Height: 5' 7.5" (1.715 m)  Weight: 152 lb (68.947 kg)   Body mass index is 23.44 kg/(m^2). Ideal Body Weight: Weight in (lb) to have BMI = 25: 161.7   GEN: WDWN, NAD, Non-toxic, Alert & Oriented x 3 HEENT: Atraumatic, Normocephalic.  Ears and Nose: No external deformity. EXTR: No clubbing/cyanosis/edema NEURO: Normal gait.  PSYCH: Normally interactive. Conversant. Not depressed or anxious appearing.  Calm demeanor.  Back:  Tender left lumbar paraspinous muscles and sacrum.  Neuro grossly intact.  Assessment and Plan: Lumbar strain  Anaprox Flexeril Hydrocodone    Signed,  Phillips Odor, MD

## 2012-11-10 ENCOUNTER — Encounter: Payer: Self-pay | Admitting: Family Medicine

## 2012-11-10 NOTE — Progress Notes (Signed)
Reviewed and agree.

## 2013-06-08 IMAGING — CR DG FOOT COMPLETE 3+V*L*
3 series · 3 of 3 positions shown · non-contrast
Comparison: None.

CLINICAL DATA: Left foot pain since an injury yesterday.

LEFT FOOT - COMPLETE 3+ VIEW

[view not recorded (1 of 3)]
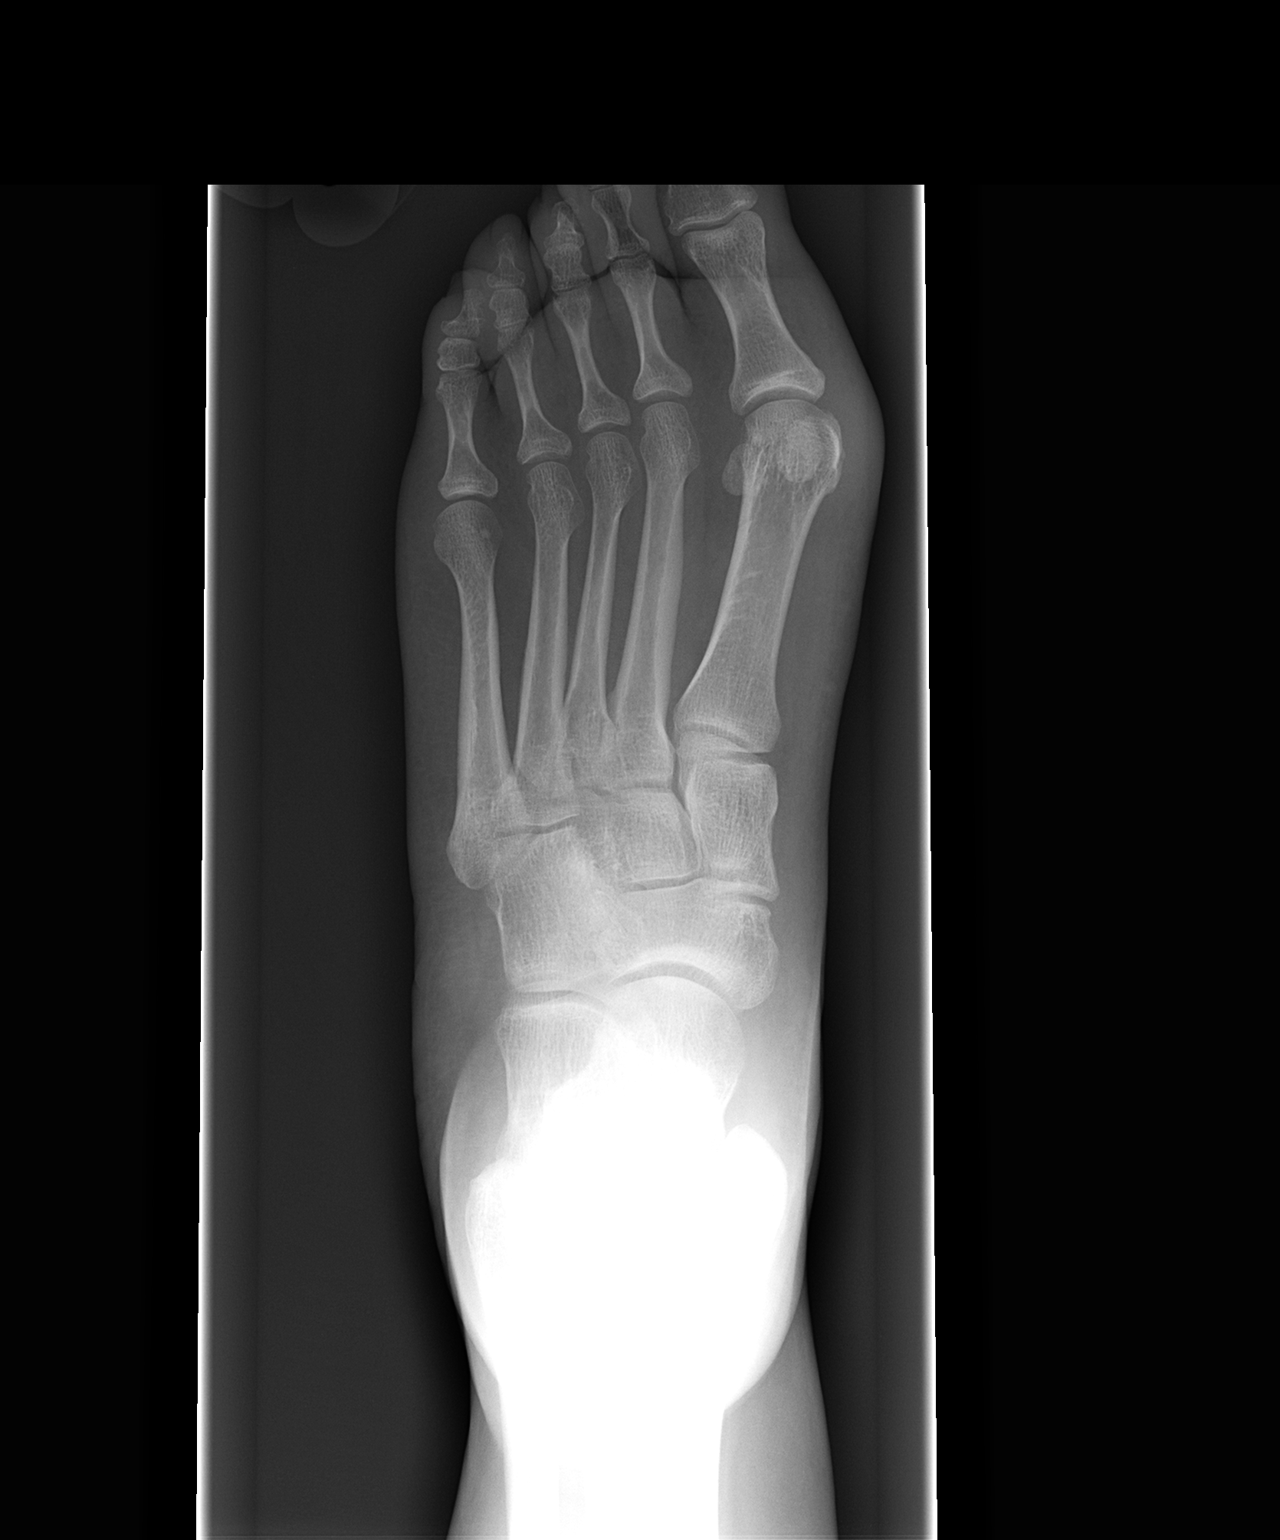

[view not recorded (2 of 3)]
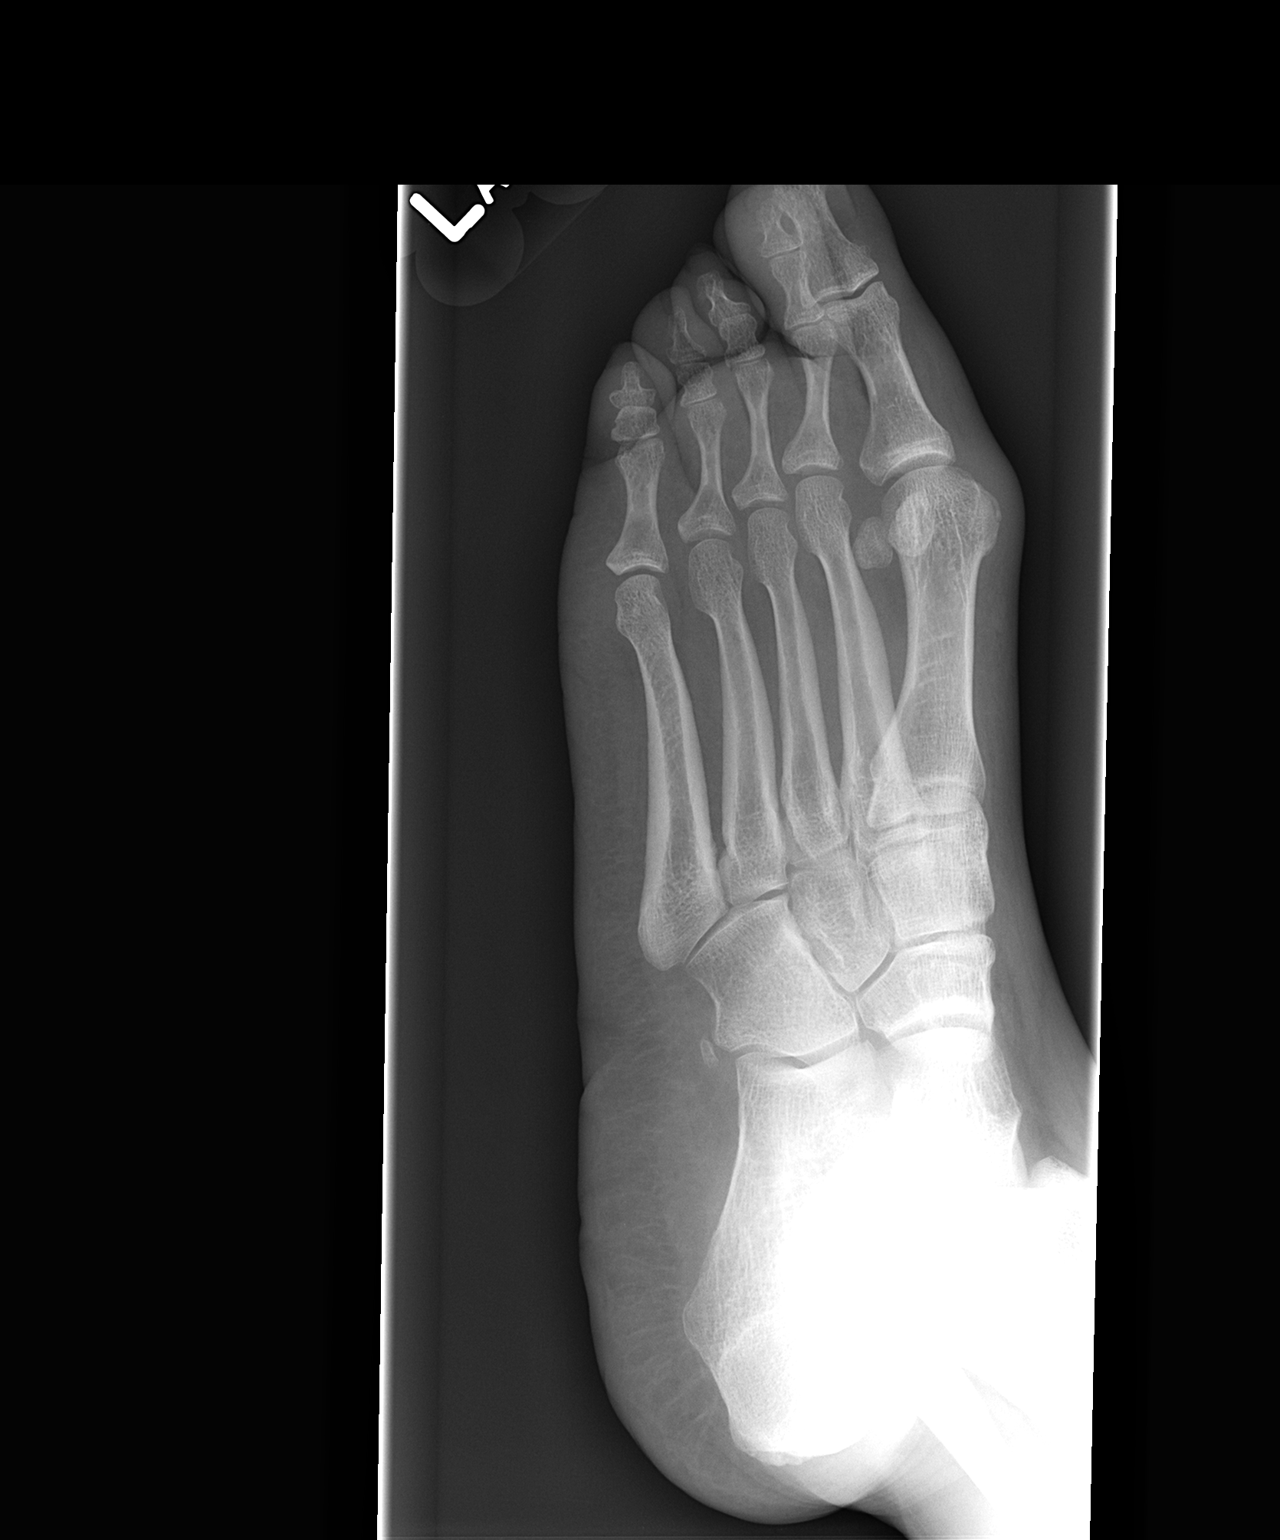

[view not recorded (3 of 3)]
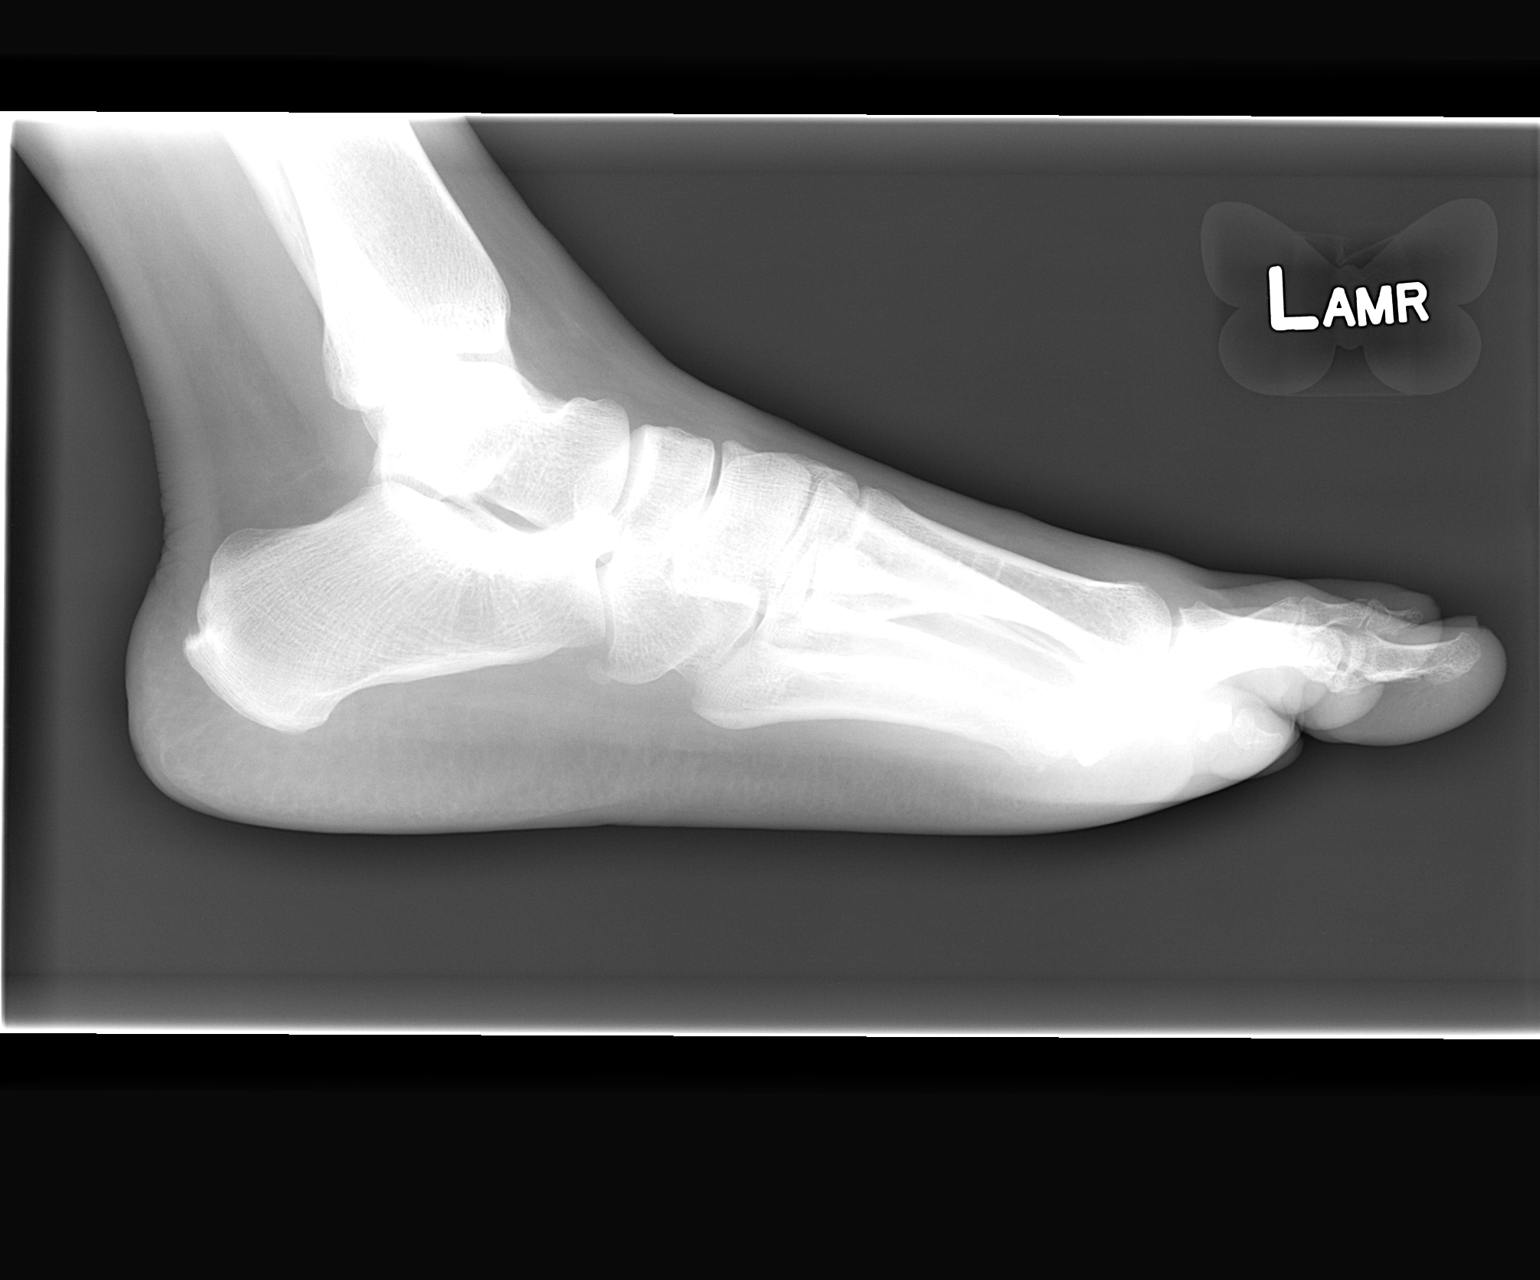

[3 of 3 positions shown; findings below may reference images not displayed]

FINDINGS: No acute bony or joint abnormality is identified.  Mild
hallux valgus deformity is noted.  Soft tissues unremarkable.
IMPRESSION: No acute finding.  Mild hallux valgus.

## 2014-06-08 ENCOUNTER — Other Ambulatory Visit (HOSPITAL_COMMUNITY)
Admission: RE | Admit: 2014-06-08 | Discharge: 2014-06-08 | Disposition: A | Discharge status: Home / Self Care | Payer: 59 | Source: Ambulatory Visit | Attending: Obstetrics & Gynecology | Admitting: Obstetrics & Gynecology

## 2014-06-08 ENCOUNTER — Encounter: Payer: Self-pay | Admitting: Obstetrics & Gynecology

## 2014-06-08 ENCOUNTER — Ambulatory Visit (INDEPENDENT_AMBULATORY_CARE_PROVIDER_SITE_OTHER): Payer: 59 | Admitting: Obstetrics & Gynecology

## 2014-06-08 VITALS — BP 130/70 | Ht 67.0 in | Wt 157.0 lb

## 2014-06-08 DIAGNOSIS — Z01411 Encounter for gynecological examination (general) (routine) with abnormal findings: Secondary | ICD-10-CM | POA: Insufficient documentation

## 2014-06-08 DIAGNOSIS — Z1151 Encounter for screening for human papillomavirus (HPV): Secondary | ICD-10-CM | POA: Insufficient documentation

## 2014-06-08 DIAGNOSIS — Z01419 Encounter for gynecological examination (general) (routine) without abnormal findings: Secondary | ICD-10-CM

## 2014-06-08 DIAGNOSIS — Z1212 Encounter for screening for malignant neoplasm of rectum: Secondary | ICD-10-CM

## 2014-06-08 DIAGNOSIS — Z1211 Encounter for screening for malignant neoplasm of colon: Secondary | ICD-10-CM

## 2014-06-08 DIAGNOSIS — R87810 Cervical high risk human papillomavirus (HPV) DNA test positive: Secondary | ICD-10-CM | POA: Diagnosis present

## 2014-06-08 NOTE — Progress Notes (Signed)
Patient ID: Vicki Ward, female   DOB: 12-Apr-1961, 53 y.o.   MRN: [id number] Subjective:     Vicki Ward is a 53 y.o. female here for a routine exam.  No LMP recorded. Patient is postmenopausal. No obstetric history on file. Birth Control Method:  Post menopausal Menstrual Calendar(currently): na  Current complaints: none.   Current acute medical issues:  diabetes   Recent Gynecologic History No LMP recorded. Patient is postmenopausal. Last Pap: 2014,  normal Last mammogram: 07/2012,  normal  Past Medical History  Diagnosis Date  . Diabetes   . Hypercholesteremia     Past Surgical History  Procedure Laterality Date  . Appendectomy    . Dilation and curettage of uterus    . Colonoscopy N/A 10/01/2012    Procedure: COLONOSCOPY;  Surgeon: Daneil Dolin, MD;  Location: [facility];  Service: Endoscopy;  Laterality: N/A;  11:00    OB History    No data available      History   Social History  . Marital Status: Divorced    Spouse Name: N/A    Number of Children: 1  . Years of Education: N/A   Occupational History  . 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  Man History Main Topics  . Smoking status: Never Smoker   . Smokeless tobacco: None  . Alcohol Use: Yes     Comment: socially  . Drug Use: No  . Sexual Activity: None   Other Topics Concern  . None   Social History Narrative    Family History  Problem Relation Age of Onset  . Rectal cancer Sister     age at onset 66, recently diagnosed   . Diabetes type I Mother   . Diabetes Mother   . Hypertension Father   . Diabetes Sister   . Diabetes Brother     Current outpatient prescriptions: aspirin 81 MG tablet, Take 81 mg by mouth daily., Disp: , Rfl: ;  Calcium Carbonate-Vitamin D (CALCIUM + D PO), Take 600 mg by mouth daily., Disp: , Rfl: ;  Canagliflozin (INVOKANA PO), Take 100 mg by mouth., Disp: , Rfl: ;  enalapril (VASOTEC) 5 MG tablet, Take 5 mg by mouth  daily., Disp: , Rfl: ;  insulin detemir (LEVEMIR) 100 UNIT/ML injection, Inject 20 Units into the skin at bedtime., Disp: , Rfl:  metformin (FORTAMET) 1000 MG (OSM) 24 hr tablet, Take 1,000 mg by mouth daily with breakfast., Disp: , Rfl: ;  simvastatin (ZOCOR) 20 MG tablet, Take 20 mg by mouth every evening., Disp: , Rfl: ;  cyclobenzaprine (FLEXERIL) 10 MG tablet, Take 1 tablet (10 mg total) by mouth 3 (three) times daily as needed for muscle spasms. (Patient not taking: Reported on 06/08/2014), Disp: 30 tablet, Rfl: 0 HYDROcodone-acetaminophen (NORCO) 5-325 MG per tablet, Take 1-2 tablets by mouth every 4 (four) hours as needed for pain. (Patient not taking: Reported on 06/08/2014), Disp: 30 tablet, Rfl: 0;  linagliptin (TRADJENTA) 5 MG TABS tablet, Take 5 mg by mouth daily., Disp: , Rfl:   Review of Systems  Review of Systems  Constitutional: Negative for fever, chills, weight loss, malaise/fatigue and diaphoresis.  HENT: Negative for hearing loss, ear pain, nosebleeds, congestion, sore throat, neck pain, tinnitus and ear discharge.   Eyes: Negative for blurred vision, double vision, photophobia, pain, discharge and redness.  Respiratory: Negative for cough, hemoptysis, sputum production, shortness of breath, wheezing and stridor.   Cardiovascular:  Negative for chest pain, palpitations, orthopnea, claudication, leg swelling and PND.  Gastrointestinal: negative for abdominal pain. Negative for heartburn, nausea, vomiting, diarrhea, constipation, blood in stool and melena.  Genitourinary: Negative for dysuria, urgency, frequency, hematuria and flank pain.  Musculoskeletal: Negative for myalgias, back pain, joint pain and falls.  Skin: Negative for itching and rash.  Neurological: Negative for dizziness, tingling, tremors, sensory change, speech change, focal weakness, seizures, loss of consciousness, weakness and headaches.  Endo/Heme/Allergies: Negative for environmental allergies and polydipsia.  Does not bruise/bleed easily.  Psychiatric/Behavioral: Negative for depression, suicidal ideas, hallucinations, memory loss and substance abuse. The patient is not nervous/anxious and does not have insomnia.        Objective:  Blood pressure 130/70, height 5\' 7"  (1.702 m), weight 157 lb (71.215 kg).   Physical Exam  Vitals reviewed. Constitutional: She is oriented to person, place, and time. She appears well-developed and well-nourished.  HENT:  Head: Normocephalic and atraumatic.        Right Ear: External ear normal.  Left Ear: External ear normal.  Nose: Nose normal.  Mouth/Throat: Oropharynx is clear and moist.  Eyes: Conjunctivae and EOM are normal. Pupils are equal, round, and reactive to light. Right eye exhibits no discharge. Left eye exhibits no discharge. No scleral icterus.  Neck: Normal range of motion. Neck supple. No tracheal deviation present. No thyromegaly present.  Cardiovascular: Normal rate, regular rhythm, normal heart sounds and intact distal pulses.  Exam reveals no gallop and no friction rub.   No murmur heard. Respiratory: Effort normal and breath sounds normal. No respiratory distress. She has no wheezes. She has no rales. She exhibits no tenderness.  GI: Soft. Bowel sounds are normal. She exhibits no distension and no mass. There is no tenderness. There is no rebound and no guarding.  Genitourinary:  Breasts no masses skin changes or nipple changes bilaterally      Vulva is normal without lesions Vagina is pink moist without discharge Cervix normal in appearance and pap is done Uterus is normal size shape and contour Adnexa is negative with normal sized ovaries  Rectal    hemoccult negative, normal tone, no masses  Musculoskeletal: Normal range of motion. She exhibits no edema and no tenderness.  Neurological: She is alert and oriented to person, place, and time. She has normal reflexes. She displays normal reflexes. No cranial nerve deficit. She exhibits  normal muscle tone. Coordination normal.  Skin: Skin is warm and dry. No rash noted. No erythema. No pallor.  Psychiatric: She has a normal mood and affect. Her behavior is normal. Judgment and thought content normal.       Assessment:    Healthy female exam.    Plan:    Mammogram ordered. Follow up in: 1 year.

## 2014-06-13 LAB — CYTOLOGY - PAP

## 2014-06-16 ENCOUNTER — Telehealth: Payer: Self-pay | Admitting: *Deleted

## 2014-06-16 NOTE — Telephone Encounter (Signed)
-----   Message from Florian Buff, MD sent at 06/15/2014  7:51 PM EST ----- Please schedule a patient for colposcopy  Dr Elonda Husky ----- Message -----    From: [facility] Interface    Sent: 06/15/2014   2:29 PM      To: Florian Buff, MD

## 2014-06-17 NOTE — Telephone Encounter (Signed)
-----   Message from Florian Buff, MD sent at 06/15/2014  7:51 PM EST ----- Please schedule a patient for colposcopy  Dr Elonda Husky ----- Message -----    From: [facility] Interface    Sent: 06/15/2014   2:29 PM      To: Florian Buff, MD

## 2014-06-17 NOTE — Telephone Encounter (Signed)
Pt informed of abnormal pap 06/08/2014 will send pamphlet by mail to pt per request, call transferred to front staff for an colposcopy appt. Pt verbalized understanding.

## 2014-06-23 ENCOUNTER — Encounter: Payer: Self-pay | Admitting: Obstetrics & Gynecology

## 2014-06-23 ENCOUNTER — Ambulatory Visit (INDEPENDENT_AMBULATORY_CARE_PROVIDER_SITE_OTHER): Payer: 59 | Admitting: Obstetrics & Gynecology

## 2014-06-23 VITALS — BP 120/70 | Wt 156.0 lb

## 2014-06-23 DIAGNOSIS — R888 Abnormal findings in other body fluids and substances: Secondary | ICD-10-CM

## 2014-06-23 DIAGNOSIS — B977 Papillomavirus as the cause of diseases classified elsewhere: Secondary | ICD-10-CM

## 2014-06-23 DIAGNOSIS — IMO0002 Reserved for concepts with insufficient information to code with codable children: Secondary | ICD-10-CM

## 2014-06-23 NOTE — Progress Notes (Signed)
Patient ID: Vicki Ward, female   DOB: 1960/09/29, 54 y.o.   MRN: [id number] Patient ID: Vicki Ward, female   DOB: 1960-07-22, 54 y.o.   MRN: [id number]  Chief Complaint  Patient presents with  . Colposcopy    postive hvp.    HPI Vicki Ward is a 54 y.o. female.   HPI  Indications: Pap smear on December 2015 showed: normal cytology with + high risk HPV. Previous colposcopy: na  Past Medical History  Diagnosis Date  . Diabetes   . Hypercholesteremia     Past Surgical History  Procedure Laterality Date  . Appendectomy    . Dilation and curettage of uterus    . Colonoscopy N/A 10/01/2012    Procedure: COLONOSCOPY;  Surgeon: Daneil Dolin, MD;  Location: [facility];  Service: Endoscopy;  Laterality: N/A;  11:00    Family History  Problem Relation Age of Onset  . Rectal cancer Sister     age at onset 45, recently diagnosed   . Diabetes type I Mother   . Diabetes Mother   . Hypertension Father   . Diabetes Sister   . Diabetes Brother     Social History History  Substance Use Topics  . Smoking status: Never Smoker   . Smokeless tobacco: Not on file  . Alcohol Use: Yes     Comment: socially    No Known Allergies  Current Outpatient Prescriptions  Medication Sig Dispense Refill  . aspirin 81 MG tablet Take 81 mg by mouth daily.    . Calcium Carbonate-Vitamin D (CALCIUM + D PO) Take 600 mg by mouth daily.    . Canagliflozin (INVOKANA PO) Take 100 mg by mouth.    . cyclobenzaprine (FLEXERIL) 10 MG tablet Take 1 tablet (10 mg total) by mouth 3 (three) times daily as needed for muscle spasms. 30 tablet 0  . enalapril (VASOTEC) 5 MG tablet Take 5 mg by mouth daily.    . insulin detemir (LEVEMIR) 100 UNIT/ML injection Inject 20 Units into the skin at bedtime.    . metformin (FORTAMET) 1000 MG (OSM) 24 hr tablet Take 1,000 mg by mouth daily with breakfast.    . simvastatin (ZOCOR) 20 MG tablet Take 20 mg by mouth every evening.    Marland Kitchen HYDROcodone-acetaminophen  (NORCO) 5-325 MG per tablet Take 1-2 tablets by mouth every 4 (four) hours as needed for pain. (Patient not taking: Reported on 06/08/2014) 30 tablet 0  . linagliptin (TRADJENTA) 5 MG TABS tablet Take 5 mg by mouth daily.     No current facility-administered medications for this visit.    Review of Systems Review of Systems  Blood pressure 120/70, weight 156 lb (70.761 kg).  Physical Exam Physical Exam  Data Reviewed today  Assessment    Procedure Details  The risks and benefits of the procedure and Written informed consent obtained.  Speculum placed in vagina and excellent visualization of cervix achieved, cervix swabbed x 3 with acetic acid solution.  Normal colposcopy  Specimens: none  Complications: none.     Plan    Repeat Pap 1 year       Benard Minturn H 06/23/2014, 2:53 PM

## 2014-07-29 ENCOUNTER — Encounter: Payer: 59 | Attending: "Endocrinology | Admitting: Nutrition

## 2014-07-29 ENCOUNTER — Encounter: Payer: Self-pay | Admitting: Nutrition

## 2014-07-29 VITALS — Ht 67.0 in | Wt 153.4 lb

## 2014-07-29 DIAGNOSIS — Z713 Dietary counseling and surveillance: Secondary | ICD-10-CM | POA: Insufficient documentation

## 2014-07-29 DIAGNOSIS — E1165 Type 2 diabetes mellitus with hyperglycemia: Secondary | ICD-10-CM

## 2014-07-29 DIAGNOSIS — Z833 Family history of diabetes mellitus: Secondary | ICD-10-CM | POA: Diagnosis not present

## 2014-07-29 DIAGNOSIS — E119 Type 2 diabetes mellitus without complications: Secondary | ICD-10-CM | POA: Diagnosis not present

## 2014-07-29 DIAGNOSIS — IMO0002 Reserved for concepts with insufficient information to code with codable children: Secondary | ICD-10-CM

## 2014-07-29 DIAGNOSIS — E118 Type 2 diabetes mellitus with unspecified complications: Secondary | ICD-10-CM

## 2014-07-29 NOTE — Progress Notes (Signed)
  Medical Nutrition Therapy:  Appt start time: 1330 end time:  1430.  Assessment:  Primary concerns today: Diabetes. LIves by herself. Cooks at home and eats out some. Baked,broiled, grilled and fried. Most A1C 8.8%. ChecksBS twice a day.  Levemir 20 units, 100 mg of Invokana, 500 mg of Metformin BID. Strong family history of DM.   Diet is in high in processed foods, high fat, high salt and low in fresh fruits, vegetables and whole grains. Tends to snack between meals.   Willing to change eating habits to improve BS and start exercising some to maintain a health weight.   Metformin 2 g per day and Invokana 100 mg/d. Levemir 20 units daily.  Preferred Learning Style:  Auditory  Visual  Hands on   Learning Readiness:    Ready  Change in progress   MEDICATIONS: See list   DIETARY INTAKE:    24-hr recall:  B (  AM): Egg salad sandwich on wheat bread, water OR Oatmeal and 2 eggs. Snk ( AM): none  L ( PM): BBQ and coleslaw with hushpuppies and ice cream sandwich, Water, Snk ( PM): orange, D ( PM): 2  [place] hot dogs on wheat buns, slaw, and hanful of tostitos,  Water, Or Crystal LIght Snk ( PM): occasionally cookies. Beverages: water,   Usual physical activity: walks some  Estimated energy needs: 1500 calories 170 g carbohydrates 112 g protein 42 g fat  Progress Towards Goal(s):  In progress.   Nutritional Diagnosis:  NB-1.1 Food and nutrition-related knowledge deficit As related to Diabetes.  As evidenced by A1C > 8.8%..    Intervention:  Nutrition and diabetes education provided on diet, meal planning, portion sizes, CHO Counting, Target ranges for blood sugars, complications and importance of getting A1C down to 7%. Signs/symptoms andTreatment of hyper/hypoglycemia..  Goal:  1. Eat three meals per day balanced according to the Plate Method. 2. Cut out snacks between meals 3. Drink water-4-5 bottles per day. 4. Increase fresh fruits and vegetables. 5. Do not skip  meals. 6. Get FBS Less than 150. 7. Test blood sugars fasting and at betimes. 8. Get A1C down to 7% in three months. 9. Exercise 30 minutes daily.  Teaching Method Utilized: Visual Auditory Hands on  Handouts given during visit include: The Plate Method Carb Counting and Food Label handouts Meal Plan Card   Barriers to learning/adherence to lifestyle change: none  Demonstrated degree of understanding via:  Teach Back   Monitoring/Evaluation:  Dietary intake, exercise, meal planning, and body weight in 1 month(s).

## 2014-08-04 NOTE — Patient Instructions (Signed)
Goal:  1. Eat three meals per day balanced according to the Plate Method. 2. Cut out snacks between meals 3. Drink water-4-5 bottles per day. 4. Increase fresh fruits and vegetables. 5. Do not skip meals. 6. Get FBS Less than 150. 7. Test blood sugars fasting and at betimes. 8. Get A1C down to 7% in three months. 9. Exercise 30 minutes daily.

## 2014-09-15 ENCOUNTER — Encounter: Payer: 59 | Attending: "Endocrinology | Admitting: Nutrition

## 2014-09-15 VITALS — Ht 67.0 in | Wt 143.8 lb

## 2014-09-15 DIAGNOSIS — Z794 Long term (current) use of insulin: Secondary | ICD-10-CM | POA: Insufficient documentation

## 2014-09-15 DIAGNOSIS — E1165 Type 2 diabetes mellitus with hyperglycemia: Secondary | ICD-10-CM | POA: Insufficient documentation

## 2014-09-15 DIAGNOSIS — Z713 Dietary counseling and surveillance: Secondary | ICD-10-CM | POA: Insufficient documentation

## 2014-09-15 DIAGNOSIS — Z79899 Other long term (current) drug therapy: Secondary | ICD-10-CM | POA: Insufficient documentation

## 2014-09-15 DIAGNOSIS — IMO0002 Reserved for concepts with insufficient information to code with codable children: Secondary | ICD-10-CM

## 2014-09-15 DIAGNOSIS — E118 Type 2 diabetes mellitus with unspecified complications: Secondary | ICD-10-CM

## 2014-09-15 DIAGNOSIS — R634 Abnormal weight loss: Secondary | ICD-10-CM

## 2014-09-15 NOTE — Progress Notes (Signed)
  Medical Nutrition Therapy:  Appt start time 1530 end time:  1600. .    Assessment:  Primary concerns today: Diabetes follow up.  A1C 7.5% down fromrm 8.8%. Lost 10 lbs in the last month. Eating 2-3 meals per day. Compliant with medications. BS log brought in. BS better but still having some high blood sugars in evening due to overeating due to not eating enough CHO at lunch time or at breakfast. Diet is low fat and not a lot of excess calories being consumed.  Still taking 20 units of Levemir, 100 mg of Invokana and 500 mg BID of Metformin. Discussed with Dr. Dorris Fetch today regarding weight loss and blood sugars. He recommends she stop taking Invokana. Pt. Was called and notified to stop taking Invokana for now and continue other meds as ordered. She verbalized understanding. FBS 73-126 mg/d. Evening Blood sugars 180-200's.   DIet is insuffient in CHO at times  And overall calories and therefore she is overeating at next meal to account for low blood sugars. Currently working day shift and will be going on night shift in 2 weeks. Needs more balanced meals, more calories and consistent food intake as discussed.   Preferred Learning Style:  Auditory  Visual  Hands on   Learning Readiness:    Ready  Change in progress   MEDICATIONS: See list   DIETARY INTAKE:    24-hr recall:  B (  AM): 1 egg omelet and english muffin and 1 glass 2% milk Snk ( AM): none  L ( PM): Toss salad with no dressing,  Chicken, apple, water, Snk ( PM): D ( PM):2 pieces of fried chicken, Snk ( PM): occasionally cookies. Beverages: water,   Usual physical activity: walks some  Estimated energy needs: 1500 calories 170 g carbohydrates 112 g protein 42 g fat  Progress Towards Goal(s):  In progress.   Nutritional Diagnosis:  NB-1.1 Food and nutrition-related knowledge deficit As related to Diabetes.  As evidenced by A1C > 8.8%..    Intervention:  Nutrition and diabetes education provided on diet,  meal planning, portion sizes, CHO Counting, Target ranges for blood sugars, complications and importance of getting A1C down to 7%. Signs/symptoms andTreatment of hyper/hypoglycemia..  Goal:  1. Eat three meals per day balanced according to the Plate Method. 2. Eat 3-4 carb choices per meal. 3. Drink water-4  bottles per day. 4. Increase fresh fruits and low carb vegetables. 5. Do not skip meals. 6. Test blood sugars fasting and at betimes. 8. Get A1C down to 7% in three months. 9. Exercise 30 minutes daily. 10. Stop Invokana per Dr. Dorris Fetch. 11. Gain 1-2 lbs per month.  Teaching Method Utilized: Visual Auditory Hands on  Handouts given during visit include: The Plate Method Carb Counting and Food Label handouts Meal Plan Card   Barriers to learning/adherence to lifestyle change: none  Demonstrated degree of understanding via:  Teach Back   Monitoring/Evaluation:  Dietary intake, exercise, meal planning, and body weight in 3 month(s).

## 2014-09-16 NOTE — Patient Instructions (Signed)
Goal:  1. Eat three meals per day balanced according to the Plate Method. 2. Eat 3-4 carb choices per meal. 3. Drink water-4  bottles per day. 4. Increase fresh fruits and low carb vegetables. 5. Do not skip meals. 6. Test blood sugars fasting and at betimes. 8. Get A1C down to 7% in three months. 9. Exercise 30 minutes daily. 10. Stop Invokana per Dr. Dorris Fetch. 11. Gain 1-2 lbs per month.

## 2014-12-05 ENCOUNTER — Other Ambulatory Visit: Payer: Self-pay

## 2014-12-07 ENCOUNTER — Encounter: Payer: 59 | Attending: Orthopedic Surgery | Admitting: Nutrition

## 2014-12-07 VITALS — Ht 67.0 in | Wt 151.0 lb

## 2014-12-07 DIAGNOSIS — Z713 Dietary counseling and surveillance: Secondary | ICD-10-CM | POA: Diagnosis not present

## 2014-12-07 DIAGNOSIS — IMO0002 Reserved for concepts with insufficient information to code with codable children: Secondary | ICD-10-CM

## 2014-12-07 DIAGNOSIS — Z794 Long term (current) use of insulin: Secondary | ICD-10-CM | POA: Diagnosis not present

## 2014-12-07 DIAGNOSIS — E118 Type 2 diabetes mellitus with unspecified complications: Secondary | ICD-10-CM | POA: Insufficient documentation

## 2014-12-07 DIAGNOSIS — E1165 Type 2 diabetes mellitus with hyperglycemia: Secondary | ICD-10-CM

## 2014-12-07 NOTE — Progress Notes (Signed)
  Medical Nutrition Therapy:  Appt start time 1100 end time:  1130. Marland Kitchen  Assessment:  Primary concerns today: Diabetes follow up. A1C 7.3%  Down from 7.5% last visit. And 8.8% before that.  Lost 2 lbs from visit before. Last visit weight must have been error. Usual weight 150's. Changed: eating habits; Eating more fresh fruits and vegetables and baked foods. Testing three times a day. Physical activity: working out 2-3 times a week.. Had no low blood sugars. Feels good. No concerns. Has been keeping a food journal and tracking what she is eating. Admits to higher blood sugars at night when she is working night shift. Has to do swing shifts and blood sugars vary from that change some. Levemir reduced to 15 units per day. Discontinued Invokana and taking 1 g of Metformin per day. No Trajenta.  Preferred Learning Style:  Auditory  Visual  Hands on   Learning Readiness:    Ready  Change in progress  MEDICATIONS: See list   DIETARY INTAKE:    24-hr recall:  B (  AM): Toast with pb and apples and honey and almond Snk ( AM): none  L ( PM): Grilled chicken patty, cabbage, yams, water Snk ( PM): apple and pb D ( PM):Wings, fried tator tots, water OR Grilled chicken, mixed vegetables, water Snk ( PM): popcorn, Beverages: water,   Usual physical activity: walking at work and working out twice a week or so.  Estimated energy needs: 1500 calories 170 g carbohydrates 112 g protein 42 g fat  Progress Towards Goal(s):  In progress.   Nutritional Diagnosis:  NB-1.1 Food and nutrition-related knowledge deficit As related to Diabetes.  As evidenced by A1C > 8.8%..    Intervention:  Nutrition and diabetes education provided on diet, meal planning, portion sizes, CHO Counting, Target ranges for blood sugars, complications and importance of getting A1C down to 7%. Signs/symptoms andTreatment of hyper/hypoglycemia..  Goal: Keep up the GREAT JOB!!!  1. Eat three meals per day balanced  according to the Plate Method. 2. Eat 3-4 carb choices per meal. 3. Drink water-4  bottles per day. 4. Increase fresh fruits and low carb vegetables. 5. Notify MD of any low blood sugars less than 70. 6. Increase walking and water if blood sugars >200 on night shift. Choose low carb veggies for snacks during 3rd shift to stay awake.  Teaching Method Utilized: Visual Auditory Hands on  Handouts given during visit include: The Plate Method Carb Counting and Food Label handouts Meal Plan Card   Barriers to learning/adherence to lifestyle change: none  Demonstrated degree of understanding via:  Teach Back   Monitoring/Evaluation:  Dietary intake, exercise, meal planning, and body weight in PRN.

## 2014-12-08 NOTE — Patient Instructions (Signed)
Goal: Keep up the GREAT JOB!!!  1. Eat three meals per day balanced according to the Plate Method. 2. Eat 3-4 carb choices per meal. 3. Drink water-4  bottles per day. 4. Increase fresh fruits and low carb vegetables. 5. Notify MD of any low blood sugars less than 70. 6. Increase walking and water if blood sugars >200 on night shift. Choose low carb veggies for snacks during 3rd shift to stay awake.

## 2015-01-16 ENCOUNTER — Other Ambulatory Visit (HOSPITAL_COMMUNITY): Payer: Self-pay | Admitting: Family Medicine

## 2015-01-16 DIAGNOSIS — Z1231 Encounter for screening mammogram for malignant neoplasm of breast: Secondary | ICD-10-CM

## 2015-01-23 ENCOUNTER — Ambulatory Visit (HOSPITAL_COMMUNITY)
Admission: RE | Admit: 2015-01-23 | Discharge: 2015-01-23 | Disposition: A | Discharge status: Home / Self Care | Payer: 59 | Source: Ambulatory Visit | Attending: Family Medicine | Admitting: Family Medicine

## 2015-01-23 DIAGNOSIS — Z1231 Encounter for screening mammogram for malignant neoplasm of breast: Secondary | ICD-10-CM | POA: Diagnosis not present

## 2015-03-07 ENCOUNTER — Ambulatory Visit (INDEPENDENT_AMBULATORY_CARE_PROVIDER_SITE_OTHER): Payer: 59 | Admitting: "Endocrinology

## 2015-03-07 ENCOUNTER — Encounter: Payer: Self-pay | Admitting: "Endocrinology

## 2015-03-07 DIAGNOSIS — E1165 Type 2 diabetes mellitus with hyperglycemia: Secondary | ICD-10-CM

## 2015-03-07 DIAGNOSIS — E785 Hyperlipidemia, unspecified: Secondary | ICD-10-CM | POA: Diagnosis not present

## 2015-03-07 DIAGNOSIS — I10 Essential (primary) hypertension: Secondary | ICD-10-CM | POA: Insufficient documentation

## 2015-03-07 DIAGNOSIS — IMO0002 Reserved for concepts with insufficient information to code with codable children: Secondary | ICD-10-CM | POA: Insufficient documentation

## 2015-03-07 DIAGNOSIS — E119 Type 2 diabetes mellitus without complications: Secondary | ICD-10-CM | POA: Insufficient documentation

## 2015-03-07 DIAGNOSIS — Z794 Long term (current) use of insulin: Secondary | ICD-10-CM

## 2015-03-07 DIAGNOSIS — E782 Mixed hyperlipidemia: Secondary | ICD-10-CM | POA: Insufficient documentation

## 2015-03-07 DIAGNOSIS — E118 Type 2 diabetes mellitus with unspecified complications: Secondary | ICD-10-CM

## 2015-03-07 NOTE — Progress Notes (Signed)
Subjective:    Patient ID: Vicki Ward, female    DOB: 1960-07-22, 54 y.o.   MRN: [id number]   Vicki Ward is a 54 y.o. female presenting on 03/07/2015 for Diabetes; Hypertension; and Hyperlipidemia     Diabetes She presents for her follow-up diabetic visit. She has type 2 (with recent a1c of 7.4% , generally improved from 8.6%) diabetes mellitus. The initial diagnosis of diabetes was made 8 years ago. Her disease course has been improving. There are no hypoglycemic associated symptoms. Pertinent negatives for hypoglycemia include no headaches or pallor. Pertinent negatives for diabetes include no chest pain, no fatigue, no foot ulcerations, no polydipsia, no polyphagia, no polyuria and no weight loss. There are no hypoglycemic complications. Symptoms are improving. There are no diabetic complications. Risk factors for coronary artery disease include diabetes mellitus, dyslipidemia and hypertension. Current diabetic treatment includes insulin injections and oral agent (monotherapy). She is compliant with treatment most of the time. Her weight is stable. She is following a diabetic diet. Meal planning includes avoidance of concentrated sweets. She has had a previous visit with a dietitian. She participates in exercise intermittently. There is no change in her home blood glucose trend. An ACE inhibitor/angiotensin II receptor blocker is being taken. Eye exam is current.  Hypertension This is a chronic problem. Pertinent negatives include no chest pain, headaches, palpitations or shortness of breath. The current treatment provides moderate improvement. There are no compliance problems.   Hyperlipidemia This is a chronic problem. The current episode started more than 1 year ago. The problem is controlled. Pertinent negatives include no chest pain or shortness of breath. Current antihyperlipidemic treatment includes statins. The current treatment provides moderate improvement of lipids. There  are no compliance problems.  Risk factors for coronary artery disease include diabetes mellitus, dyslipidemia and hypertension.      Review of Systems  Constitutional: Negative for weight loss and fatigue.  HENT: Negative for dental problem, mouth sores and trouble swallowing.   Eyes: Negative for visual disturbance.  Respiratory: Negative for cough and shortness of breath.   Cardiovascular: Negative for chest pain, palpitations and leg swelling.  Endocrine: Negative for polydipsia, polyphagia and polyuria.  Musculoskeletal: Negative for back pain and gait problem.  Skin: Negative for color change, pallor, rash and wound.  Neurological: Negative for headaches.         Objective:    BP 119/70 mmHg  Pulse 72  Ht 5\' 7"  (1.702 m)  Wt 156 lb (70.761 kg)  BMI 24.43 kg/m2  SpO2 99%  Wt Readings from Last 3 Encounters:  03/07/15 156 lb (70.761 kg)  12/07/14 151 lb (68.493 kg)  09/15/14 143 lb 12.8 oz (65.227 kg)    Physical Exam  Constitutional: She is oriented to person, place, and time. She appears well-developed and well-nourished.  HENT:  Head: Normocephalic and atraumatic.  Eyes: EOM are normal.  Neck: Normal range of motion. Neck supple.  Cardiovascular: Normal rate and regular rhythm.   Pulmonary/Chest: Effort normal and breath sounds normal. No respiratory distress. She has no wheezes.  Abdominal: Soft. Bowel sounds are normal. She exhibits no distension. There is no guarding.  Musculoskeletal: Normal range of motion.  Neurological: She is alert and oriented to person, place, and time. She has normal reflexes.  Skin: Skin is warm and dry. No rash noted. No erythema. No pallor.  Psychiatric: She has a normal mood and affect. Her behavior is normal. Judgment and thought content normal.  Results for orders placed or performed in visit on 06/08/14  Cytology - PAP  Result Value Ref Range   CYTOLOGY - PAP PAP RESULT    Complete Blood Count (Most recent): Lab  Results  Component Value Date   WBC 11.0* 05/12/2008   HGB 13.1 05/12/2008   HCT 38.8 05/12/2008   MCV 94.3 05/12/2008   PLT 287 05/12/2008   Chemistry (most recent): Lab Results  Component Value Date   NA 135 05/12/2008   K 3.9 05/12/2008   CL 101 05/12/2008   CO2 30 05/12/2008   BUN 5* 05/12/2008   CREATININE 0.63 05/12/2008   A1c: 7.4%, normal renal function.     Assessment & Plan:   Her a1c is steady at 7.4%. I have counseled the patient  to stay on diet management by adopting a carbohydrate restricted diet. Her body weight is appropriate at this time.  - I will continue on  Levemir to 15 units qhs, associated with monitoring of BG QAM. She will not need prandial insulin for now. - I will continue Metformin 1gm po BID   -Target numbers for a1c, LDL, HDL, Triglycerides, waist circumference were discussed in detail. -Patient is on ACEI and Statin medications and encouraged to continue to follow up with Ophthalmology at least yearly or according to recommendations, and advised to stay away from smoking. -Her BP is better  , I advised her to resume  Enalapril .  -Patient is advised to continue f/u with her PMD for primary care needs.   Follow up plan: Return in about 3 months (around 06/06/2015) for f/u for current problems., Diabetes, High Blood Pressure, High Cholesterol, with previsit labs.

## 2015-04-07 ENCOUNTER — Other Ambulatory Visit: Payer: Self-pay | Admitting: "Endocrinology

## 2015-05-29 ENCOUNTER — Ambulatory Visit: Payer: 59 | Admitting: Nutrition

## 2015-05-29 ENCOUNTER — Telehealth: Payer: Self-pay | Admitting: Nutrition

## 2015-05-29 NOTE — Telephone Encounter (Signed)
VM to return call to reschedule missed appt. PC 

## 2015-06-09 ENCOUNTER — Ambulatory Visit: Payer: 59 | Admitting: "Endocrinology

## 2015-06-09 ENCOUNTER — Ambulatory Visit: Payer: 59 | Admitting: Nutrition

## 2015-07-03 ENCOUNTER — Encounter: Payer: 59 | Attending: Internal Medicine | Admitting: Nutrition

## 2015-07-03 ENCOUNTER — Encounter: Payer: Self-pay | Admitting: Nutrition

## 2015-07-03 VITALS — Ht 67.0 in | Wt 161.0 lb

## 2015-07-03 DIAGNOSIS — E119 Type 2 diabetes mellitus without complications: Secondary | ICD-10-CM | POA: Insufficient documentation

## 2015-07-03 NOTE — Progress Notes (Signed)
  Medical Nutrition Therapy:  Appt start time 1100 end time:  1130. Marland Kitchen  Assessment:  Primary concerns today: Diabetes follow up. A1C 7.3% (/2016)  Needs to get lab work to see Dr. Dorris Fetch. Lost her sister to cancer recently and her son has been [place]. She notes she is under a lot stress and has been eating sweets to help deal with her stress. BS meter downloaded. BS in the 200's at night due to night time snacking. Walking 30 minutes a few days per week. Eats three meals per day. Gained a few pounds since last visit. Taking Metformin -fortamet and  15 units of Levemir. Needs more lower carb vegetables and cut out simple carbs. Needs more water.   Preferred Learning Style:  Auditory  Visual  Hands on   Learning Readiness:    Ready  Change in progress  MEDICATIONS: See list   DIETARY INTAKE:    24-hr recall:  Eating three meals per day. Has been snacking at night with simple sugars at times causing BS elevation.  Usual physical activity: walking at work and working out twice a week or so.  Estimated energy needs: 1500 calories 170 g carbohydrates 112 g protein 42 g fat  Progress Towards Goal(s):  In progress.   Nutritional Diagnosis:  NB-1.1 Food and nutrition-related knowledge deficit As related to Diabetes.  As evidenced by A1C > 8.8%..    Intervention:  Nutrition and diabetes education provided on diet, meal planning, portion sizes, CHO Counting, Target ranges for blood sugars, complications and importance of getting A1C down to 7%. Signs/symptoms andTreatment of hyper/hypoglycemia..  Goals: 1. Follow Plate Method 2. Cut out popcorn at night and late night snacks. 3. Continue to exercise 30 minutes 4 days per week. 4. Aim for 5 bottles of water per day. 5. Get bedtime blood sugars less than 180 mg/dl. 6. Get lab work done and schedule appt with Dr. Kennieth Francois.    Teaching Method Utilized: Visual Auditory Hands on  Handouts given during visit  include: The Plate Method Carb Counting and Food Label handouts Meal Plan Card   Barriers to learning/adherence to lifestyle change: none  Demonstrated degree of understanding via:  Teach Back   Monitoring/Evaluation:  Dietary intake, exercise, meal planning, and body weight in three months with appt with Dr. Dorris Fetch

## 2015-07-03 NOTE — Patient Instructions (Addendum)
Goals: 1. Follow Plate Method 2. Cut out popcorn at night and late night snacks. 3. Continue to exercise 30 minutes 4 days per week. 4. Aim for 5 bottles of water per day. 5. Get bedtime blood sugars less than 180 mg/dl. 6. Get lab work done and schedule appt with Dr. Kennieth Francois.

## 2015-08-02 ENCOUNTER — Other Ambulatory Visit: Payer: Self-pay | Admitting: "Endocrinology

## 2015-08-28 ENCOUNTER — Other Ambulatory Visit: Payer: Self-pay | Admitting: "Endocrinology

## 2015-08-29 LAB — COMPREHENSIVE METABOLIC PANEL
A/G RATIO: 1.3 (ref 1.2–2.2)
ALT: 15 IU/L (ref 0–32)
AST: 19 IU/L (ref 0–40)
Albumin: 4.6 g/dL (ref 3.5–5.5)
Alkaline Phosphatase: 100 IU/L (ref 39–117)
BILIRUBIN TOTAL: 0.5 mg/dL (ref 0.0–1.2)
BUN/Creatinine Ratio: 15 (ref 9–23)
BUN: 10 mg/dL (ref 6–24)
CALCIUM: 9.8 mg/dL (ref 8.7–10.2)
CHLORIDE: 99 mmol/L (ref 96–106)
CO2: 27 mmol/L (ref 18–29)
Creatinine, Ser: 0.65 mg/dL (ref 0.57–1.00)
GFR calc Af Amer: 117 mL/min/{1.73_m2} (ref >59)
GFR calc non Af Amer: 102 mL/min/{1.73_m2} (ref >59)
GLUCOSE: 122 mg/dL — AB (ref 65–99)
Globulin, Total: 3.5 g/dL (ref 1.5–4.5)
POTASSIUM: 4.2 mmol/L (ref 3.5–5.2)
Sodium: 142 mmol/L (ref 134–144)
TOTAL PROTEIN: 8.1 g/dL (ref 6.0–8.5)

## 2015-08-29 LAB — BILIRUBIN, DIRECT: BILIRUBIN, DIRECT: 0.13 mg/dL (ref 0.00–0.40)

## 2015-08-29 LAB — HGB A1C W/O EAG: Hgb A1c MFr Bld: 8.4 % — ABNORMAL HIGH (ref 4.8–5.6)

## 2015-09-06 ENCOUNTER — Ambulatory Visit (INDEPENDENT_AMBULATORY_CARE_PROVIDER_SITE_OTHER): Payer: 59 | Admitting: "Endocrinology

## 2015-09-06 ENCOUNTER — Encounter: Payer: Self-pay | Admitting: "Endocrinology

## 2015-09-06 VITALS — BP 131/73 | HR 72 | Ht 67.0 in | Wt 160.0 lb

## 2015-09-06 DIAGNOSIS — E118 Type 2 diabetes mellitus with unspecified complications: Secondary | ICD-10-CM

## 2015-09-06 DIAGNOSIS — E785 Hyperlipidemia, unspecified: Secondary | ICD-10-CM | POA: Diagnosis not present

## 2015-09-06 DIAGNOSIS — IMO0002 Reserved for concepts with insufficient information to code with codable children: Secondary | ICD-10-CM

## 2015-09-06 DIAGNOSIS — I10 Essential (primary) hypertension: Secondary | ICD-10-CM | POA: Diagnosis not present

## 2015-09-06 DIAGNOSIS — Z794 Long term (current) use of insulin: Secondary | ICD-10-CM | POA: Diagnosis not present

## 2015-09-06 DIAGNOSIS — E1165 Type 2 diabetes mellitus with hyperglycemia: Secondary | ICD-10-CM | POA: Diagnosis not present

## 2015-09-06 MED ORDER — BASAGLAR KWIKPEN 100 UNIT/ML ~~LOC~~ SOPN: 25.0000 [IU] | PEN_INJECTOR | Freq: Every day | SUBCUTANEOUS | Status: DC

## 2015-09-06 NOTE — Progress Notes (Signed)
Subjective:    Patient ID: Vicki Ward, female    DOB: [birth year], 55 y.o.   MRN: [id number]   Vicki Ward is a 55 y.o. female presenting on 09/06/2015 for Follow-up     Diabetes She presents for her follow-up diabetic visit. She has type 2 (with recent a1c of 7.4% , generally improved from 8.6%) diabetes mellitus. The initial diagnosis of diabetes was made 8 years ago. Her disease course has been worsening. There are no hypoglycemic associated symptoms. Pertinent negatives for hypoglycemia include no headaches or pallor. Pertinent negatives for diabetes include no chest pain, no fatigue, no foot ulcerations, no polydipsia, no polyphagia, no polyuria and no weight loss. There are no hypoglycemic complications. Symptoms are worsening. There are no diabetic complications. Risk factors for coronary artery disease include diabetes mellitus, dyslipidemia and hypertension. Current diabetic treatment includes insulin injections and oral agent (monotherapy). She is compliant with treatment most of the time. Her weight is stable. She is following a diabetic diet. Meal planning includes avoidance of concentrated sweets. She has had a previous visit with a dietitian. She participates in exercise intermittently. There is no change in her home blood glucose trend. Her breakfast blood glucose range is generally 140-180 mg/dl. An ACE inhibitor/angiotensin II receptor blocker is being taken. Eye exam is current.  Hypertension This is a chronic problem. Pertinent negatives include no chest pain, headaches, palpitations or shortness of breath. The current treatment provides moderate improvement. There are no compliance problems.   Hyperlipidemia This is a chronic problem. The current episode started more than 1 year ago. The problem is controlled. Pertinent negatives include no chest pain or shortness of breath. Current antihyperlipidemic treatment includes statins. The current treatment provides moderate  improvement of lipids. There are no compliance problems.  Risk factors for coronary artery disease include diabetes mellitus, dyslipidemia and hypertension.      Review of Systems  Constitutional: Negative for weight loss and fatigue.  HENT: Negative for dental problem, mouth sores and trouble swallowing.   Eyes: Negative for visual disturbance.  Respiratory: Negative for cough and shortness of breath.   Cardiovascular: Negative for chest pain, palpitations and leg swelling.  Endocrine: Negative for polydipsia, polyphagia and polyuria.  Musculoskeletal: Negative for back pain and gait problem.  Skin: Negative for color change, pallor, rash and wound.  Neurological: Negative for headaches.       Objective:    BP 131/73 mmHg  Pulse 72  Ht 5\' 7"  (1.702 m)  Wt 160 lb (72.576 kg)  BMI 25.05 kg/m2  SpO2 96%  Wt Readings from Last 3 Encounters:  09/06/15 160 lb (72.576 kg)  07/03/15 161 lb (73.029 kg)  03/07/15 156 lb (70.761 kg)    Physical Exam  Constitutional: She is oriented to person, place, and time. She appears well-developed and well-nourished.  HENT:  Head: Normocephalic and atraumatic.  Eyes: EOM are normal.  Neck: Normal range of motion. Neck supple.  Cardiovascular: Normal rate and regular rhythm.   Pulmonary/Chest: Effort normal and breath sounds normal. No respiratory distress. She has no wheezes.  Abdominal: Soft. Bowel sounds are normal. She exhibits no distension. There is no guarding.  Musculoskeletal: Normal range of motion.  Neurological: She is alert and oriented to person, place, and time. She has normal reflexes.  Skin: Skin is warm and dry. No rash noted. No erythema. No pallor.  Psychiatric: She has a normal mood and affect. Her behavior is normal. Judgment and thought content normal.  Results for orders placed or performed in visit on 08/28/15  Comprehensive metabolic panel  Result Value Ref Range   Glucose 122 (H) 65 - 99 mg/dL   BUN 10 6 - 24  mg/dL   Creatinine, Ser 0.65 0.57 - 1.00 mg/dL   GFR calc non Af Amer 102 >59 mL/min/1.73   GFR calc Af Amer 117 >59 mL/min/1.73   BUN/Creatinine Ratio 15 9 - 23   Sodium 142 134 - 144 mmol/L   Potassium 4.2 3.5 - 5.2 mmol/L   Chloride 99 96 - 106 mmol/L   CO2 27 18 - 29 mmol/L   Calcium 9.8 8.7 - 10.2 mg/dL   Total Protein 8.1 6.0 - 8.5 g/dL   Albumin 4.6 3.5 - 5.5 g/dL   Globulin, Total 3.5 1.5 - 4.5 g/dL   Albumin/Globulin Ratio 1.3 1.2 - 2.2   Bilirubin Total 0.5 0.0 - 1.2 mg/dL   Alkaline Phosphatase 100 39 - 117 IU/L   AST 19 0 - 40 IU/L   ALT 15 0 - 32 IU/L  Hgb A1c w/o eAG  Result Value Ref Range   Hgb A1c MFr Bld 8.4 (H) 4.8 - 5.6 %  Bilirubin, direct  Result Value Ref Range   Bilirubin, Direct 0.13 0.00 - 0.40 mg/dL   Complete Blood Count (Most recent): Lab Results  Component Value Date   WBC 11.0* 05/12/2008   HGB 13.1 05/12/2008   HCT 38.8 05/12/2008   MCV 94.3 05/12/2008   PLT 287 05/12/2008   Chemistry (most recent): Lab Results  Component Value Date   NA 142 08/28/2015   K 4.2 08/28/2015   CL 99 08/28/2015   CO2 27 08/28/2015   BUN 10 08/28/2015   CREATININE 0.65 08/28/2015       Assessment & Plan:   Her a1c is Higher at 8.4% increasing from  7.4%. I have counseled the patient  to stay on diet management by adopting a carbohydrate restricted diet. Her body weight is appropriate at this time.  - I wiincrease Levemir to 25 units  qhs, associated with monitoring of BG QAM. She will not need prandial insulin for now. - I will continue Metformin 1gm po BID   -Target numbers for a1c, LDL, HDL, Triglycerides, waist circumference were discussed in detail. -Patient is on ACEI and Statin medications and encouraged to continue to follow up with Ophthalmology at least yearly or according to recommendations, and advised to stay away from smoking. -Her BP is better  , I advised her to resume  Enalapril .  -Patient is advised to continue f/u with her PMD for  primary care needs.   Follow up plan: Return in about 3 months (around 12/07/2015) for diabetes, high blood pressure, high cholesterol, follow up with pre-visit labs, meter, and logs.

## 2015-09-06 NOTE — Patient Instructions (Signed)

## 2015-12-08 ENCOUNTER — Ambulatory Visit: Payer: 59 | Admitting: "Endocrinology

## 2015-12-13 ENCOUNTER — Other Ambulatory Visit: Payer: Self-pay | Admitting: "Endocrinology

## 2015-12-14 LAB — BASIC METABOLIC PANEL
BUN/Creatinine Ratio: 21 (ref 9–23)
BUN: 14 mg/dL (ref 6–24)
CALCIUM: 9.2 mg/dL (ref 8.7–10.2)
CHLORIDE: 104 mmol/L (ref 96–106)
CO2: 25 mmol/L (ref 18–29)
Creatinine, Ser: 0.67 mg/dL (ref 0.57–1.00)
GFR calc non Af Amer: 101 mL/min/{1.73_m2} (ref >59)
GFR, EST AFRICAN AMERICAN: 116 mL/min/{1.73_m2} (ref >59)
GLUCOSE: 97 mg/dL (ref 65–99)
POTASSIUM: 4.5 mmol/L (ref 3.5–5.2)
Sodium: 144 mmol/L (ref 134–144)

## 2015-12-14 LAB — HGB A1C W/O EAG: HEMOGLOBIN A1C: 8.5 % — AB (ref 4.8–5.6)

## 2015-12-20 ENCOUNTER — Encounter: Payer: Self-pay | Admitting: "Endocrinology

## 2015-12-20 ENCOUNTER — Ambulatory Visit (INDEPENDENT_AMBULATORY_CARE_PROVIDER_SITE_OTHER): Payer: 59 | Admitting: "Endocrinology

## 2015-12-20 VITALS — BP 123/77 | HR 65 | Ht 67.0 in | Wt 159.0 lb

## 2015-12-20 DIAGNOSIS — E1165 Type 2 diabetes mellitus with hyperglycemia: Secondary | ICD-10-CM | POA: Diagnosis not present

## 2015-12-20 DIAGNOSIS — I10 Essential (primary) hypertension: Secondary | ICD-10-CM | POA: Diagnosis not present

## 2015-12-20 DIAGNOSIS — E118 Type 2 diabetes mellitus with unspecified complications: Secondary | ICD-10-CM | POA: Diagnosis not present

## 2015-12-20 DIAGNOSIS — Z794 Long term (current) use of insulin: Secondary | ICD-10-CM

## 2015-12-20 DIAGNOSIS — E785 Hyperlipidemia, unspecified: Secondary | ICD-10-CM | POA: Diagnosis not present

## 2015-12-20 DIAGNOSIS — IMO0002 Reserved for concepts with insufficient information to code with codable children: Secondary | ICD-10-CM

## 2015-12-20 NOTE — Progress Notes (Signed)
Subjective:    Patient ID: Vicki Ward, female    DOB: 1961/05/18, 55 y.o.   MRN: [id number]   Vicki Ward is a 55 y.o. female presenting on 12/20/2015 for Follow-up     Diabetes She presents for her follow-up diabetic visit. She has type 2 (with recent a1c of 7.4% , generally improved from 8.6%) diabetes mellitus. The initial diagnosis of diabetes was made 8 years ago. Her disease course has been worsening. There are no hypoglycemic associated symptoms. Pertinent negatives for hypoglycemia include no headaches or pallor. Pertinent negatives for diabetes include no chest pain, no fatigue, no foot ulcerations, no polydipsia, no polyphagia, no polyuria and no weight loss. There are no hypoglycemic complications. Symptoms are worsening. There are no diabetic complications. Risk factors for coronary artery disease include diabetes mellitus, dyslipidemia and hypertension. Current diabetic treatment includes insulin injections and oral agent (monotherapy). She is compliant with treatment most of the time. Her weight is stable. She is following a diabetic diet. Meal planning includes avoidance of concentrated sweets. She has had a previous visit with a dietitian. She participates in exercise intermittently. There is no change in her home blood glucose trend. Her breakfast blood glucose range is generally 110-130 mg/dl. Her overall blood glucose range is 110-130 mg/dl. An ACE inhibitor/angiotensin II receptor blocker is being taken. Eye exam is current.  Hypertension This is a chronic problem. Pertinent negatives include no chest pain, headaches, palpitations or shortness of breath. The current treatment provides moderate improvement. There are no compliance problems.   Hyperlipidemia This is a chronic problem. The current episode started more than 1 year ago. The problem is controlled. Pertinent negatives include no chest pain or shortness of breath. Current antihyperlipidemic treatment includes  statins. The current treatment provides moderate improvement of lipids. There are no compliance problems.  Risk factors for coronary artery disease include diabetes mellitus, dyslipidemia and hypertension.      Review of Systems  Constitutional: Negative for weight loss and fatigue.  HENT: Negative for dental problem, mouth sores and trouble swallowing.   Eyes: Negative for visual disturbance.  Respiratory: Negative for cough and shortness of breath.   Cardiovascular: Negative for chest pain, palpitations and leg swelling.  Endocrine: Negative for polydipsia, polyphagia and polyuria.  Musculoskeletal: Negative for back pain and gait problem.  Skin: Negative for color change, pallor, rash and wound.  Neurological: Negative for headaches.       Objective:    BP 123/77 mmHg  Pulse 65  Ht 5\' 7"  (1.702 m)  Wt 159 lb (72.122 kg)  BMI 24.90 kg/m2  Wt Readings from Last 3 Encounters:  12/20/15 159 lb (72.122 kg)  09/06/15 160 lb (72.576 kg)  07/03/15 161 lb (73.029 kg)    Physical Exam  Constitutional: She is oriented to person, place, and time. She appears well-developed and well-nourished.  HENT:  Head: Normocephalic and atraumatic.  Eyes: EOM are normal.  Neck: Normal range of motion. Neck supple.  Cardiovascular: Normal rate and regular rhythm.   Pulmonary/Chest: Effort normal and breath sounds normal. No respiratory distress. She has no wheezes.  Abdominal: Soft. Bowel sounds are normal. She exhibits no distension. There is no guarding.  Musculoskeletal: Normal range of motion.  Neurological: She is alert and oriented to person, place, and time. She has normal reflexes.  Skin: Skin is warm and dry. No rash noted. No erythema. No pallor.  Psychiatric: She has a normal mood and affect. Her behavior is normal. Judgment and  thought content normal.     Results for orders placed or performed in visit on [id number]  Basic metabolic panel  Result Value Ref Range   Glucose 97 65 -  99 mg/dL   BUN 14 6 - 24 mg/dL   Creatinine, Ser 0.67 0.57 - 1.00 mg/dL   GFR calc non Af Amer 101 >59 mL/min/1.73   GFR calc Af Amer 116 >59 mL/min/1.73   BUN/Creatinine Ratio 21 9 - 23   Sodium 144 134 - 144 mmol/L   Potassium 4.5 3.5 - 5.2 mmol/L   Chloride 104 96 - 106 mmol/L   CO2 25 18 - 29 mmol/L   Calcium 9.2 8.7 - 10.2 mg/dL  Hgb A1c w/o eAG  Result Value Ref Range   Hgb A1c MFr Bld 8.5 (H) 4.8 - 5.6 %   Complete Blood Count (Most recent): Lab Results  Component Value Date   WBC 11.0* 05/12/2008   HGB 13.1 05/12/2008   HCT 38.8 05/12/2008   MCV 94.3 05/12/2008   PLT 287 05/12/2008   Chemistry (most recent): Lab Results  Component Value Date   NA 144 12/13/2015   K 4.5 12/13/2015   CL 104 12/13/2015   CO2 25 12/13/2015   BUN 14 12/13/2015   CREATININE 0.67 12/13/2015   Diagnosis:  1.uncontrolled type 2 diabetes 2. Hyperlipidemia 3. Hypertension    Assessment & Plan:   Her a1c is Higher at 8.5% increasing from  7.4%. - This is likely driven by above target postprandial glycemic profile. She brought a meter showing controlled fasting glucose profile averaging 119 for the last 30 days (n=35).  I have counseled the patient  to stay on diet management by adopting a carbohydrate restricted diet. Her body weight is appropriate at this time.  - I will continue Levemir at 18 units  qhs, associated with monitoring of BG  BEFORE MEALS AND AT BEDTIME. She will bring her meter and logs in 2 weeks for reevaluation. - She may require prandial insulin for better control.  - I will continue Metformin 1gm po BID   -Target numbers for a1c, LDL, HDL, Triglycerides, waist circumference were discussed in detail. -Patient is on ACEI and Statin medications and encouraged to continue to follow up with Ophthalmology at least yearly or according to recommendations, and advised to stay away from smoking. -Her BP is controlled  , I advised her to resume  Enalapril .  -Patient is  advised to continue f/u with her PMD for primary care needs.   Follow up plan: Return in about 2 weeks (around 01/03/2016) for follow up with meter and logs- no labs.

## 2016-01-09 ENCOUNTER — Ambulatory Visit (INDEPENDENT_AMBULATORY_CARE_PROVIDER_SITE_OTHER): Payer: 59 | Admitting: "Endocrinology

## 2016-01-09 ENCOUNTER — Encounter: Payer: Self-pay | Admitting: "Endocrinology

## 2016-01-09 ENCOUNTER — Ambulatory Visit: Payer: 59 | Admitting: "Endocrinology

## 2016-01-09 VITALS — BP 113/68 | Ht 67.0 in | Wt 157.8 lb

## 2016-01-09 DIAGNOSIS — IMO0002 Reserved for concepts with insufficient information to code with codable children: Secondary | ICD-10-CM

## 2016-01-09 DIAGNOSIS — I10 Essential (primary) hypertension: Secondary | ICD-10-CM

## 2016-01-09 DIAGNOSIS — E118 Type 2 diabetes mellitus with unspecified complications: Secondary | ICD-10-CM | POA: Diagnosis not present

## 2016-01-09 DIAGNOSIS — Z794 Long term (current) use of insulin: Secondary | ICD-10-CM

## 2016-01-09 DIAGNOSIS — E785 Hyperlipidemia, unspecified: Secondary | ICD-10-CM

## 2016-01-09 DIAGNOSIS — E1165 Type 2 diabetes mellitus with hyperglycemia: Secondary | ICD-10-CM | POA: Diagnosis not present

## 2016-01-09 NOTE — Progress Notes (Signed)
Subjective:    Patient ID: Vicki Ward, female    DOB: 01-Jun-1961, 55 y.o.   MRN: [id number]   Vicki Ward is a 55 y.o. female presenting on 01/09/2016 for Diabetes     Diabetes  She presents for her follow-up diabetic visit. She has type 2 (with recent a1c of 7.4% , generally improved from 8.6%) diabetes mellitus. The initial diagnosis of diabetes was made 8 years ago. Her disease course has been improving. There are no hypoglycemic associated symptoms. Pertinent negatives for hypoglycemia include no headaches or pallor. Pertinent negatives for diabetes include no chest pain, no fatigue, no foot ulcerations, no polydipsia, no polyphagia, no polyuria and no weight loss. There are no hypoglycemic complications. Symptoms are improving. There are no diabetic complications. Risk factors for coronary artery disease include diabetes mellitus, dyslipidemia and hypertension. Current diabetic treatment includes insulin injections and oral agent (monotherapy). She is compliant with treatment most of the time. Her weight is stable. She is following a diabetic diet. Meal planning includes avoidance of concentrated sweets. She has had a previous visit with a dietitian. She participates in exercise intermittently. There is no change in her home blood glucose trend. Her breakfast blood glucose range is generally 130-140 mg/dl. Her lunch blood glucose range is generally 130-140 mg/dl. Her dinner blood glucose range is generally 130-140 mg/dl. Her overall blood glucose range is 130-140 mg/dl. An ACE inhibitor/angiotensin II receptor blocker is being taken. Eye exam is current.  Hypertension  This is a chronic problem. Pertinent negatives include no chest pain, headaches, palpitations or shortness of breath. The current treatment provides moderate improvement. There are no compliance problems.   Hyperlipidemia  This is a chronic problem. The current episode started more than 1 year ago. The problem is  controlled. Pertinent negatives include no chest pain or shortness of breath. Current antihyperlipidemic treatment includes statins. The current treatment provides moderate improvement of lipids. There are no compliance problems.  Risk factors for coronary artery disease include diabetes mellitus, dyslipidemia and hypertension.      Review of Systems  Constitutional: Negative for fatigue and weight loss.  HENT: Negative for dental problem, mouth sores and trouble swallowing.   Eyes: Negative for visual disturbance.  Respiratory: Negative for cough and shortness of breath.   Cardiovascular: Negative for chest pain, palpitations and leg swelling.  Endocrine: Negative for polydipsia, polyphagia and polyuria.  Musculoskeletal: Negative for back pain and gait problem.  Skin: Negative for color change, pallor, rash and wound.  Neurological: Negative for headaches.       Objective:    BP 113/68   Ht 5\' 7"  (1.702 m)   Wt 157 lb 12.8 oz (71.6 kg)   SpO2 97%   BMI 24.71 kg/m   Wt Readings from Last 3 Encounters:  01/09/16 157 lb 12.8 oz (71.6 kg)  12/20/15 159 lb (72.1 kg)  09/06/15 160 lb (72.6 kg)    Physical Exam  Constitutional: She is oriented to person, place, and time. She appears well-developed and well-nourished.  HENT:  Head: Normocephalic and atraumatic.  Eyes: EOM are normal.  Neck: Normal range of motion. Neck supple.  Cardiovascular: Normal rate and regular rhythm.   Pulmonary/Chest: Effort normal and breath sounds normal. No respiratory distress. She has no wheezes.  Abdominal: Soft. Bowel sounds are normal. She exhibits no distension. There is no guarding.  Musculoskeletal: Normal range of motion.  Neurological: She is alert and oriented to person, place, and time. She has normal reflexes.  Skin: Skin is warm and dry. No rash noted. No erythema. No pallor.  Psychiatric: She has a normal mood and affect. Her behavior is normal. Judgment and thought content normal.      Results for orders placed or performed in visit on [id number]  Basic metabolic panel  Result Value Ref Range   Glucose 97 65 - 99 mg/dL   BUN 14 6 - 24 mg/dL   Creatinine, Ser 0.67 0.57 - 1.00 mg/dL   GFR calc non Af Amer 101 >59 mL/min/1.73   GFR calc Af Amer 116 >59 mL/min/1.73   BUN/Creatinine Ratio 21 9 - 23   Sodium 144 134 - 144 mmol/L   Potassium 4.5 3.5 - 5.2 mmol/L   Chloride 104 96 - 106 mmol/L   CO2 25 18 - 29 mmol/L   Calcium 9.2 8.7 - 10.2 mg/dL  Hgb A1c w/o eAG  Result Value Ref Range   Hgb A1c MFr Bld 8.5 (H) 4.8 - 5.6 %   Complete Blood Count (Most recent): Lab Results  Component Value Date   WBC 11.0 (H) 05/12/2008   HGB 13.1 05/12/2008   HCT 38.8 05/12/2008   MCV 94.3 05/12/2008   PLT 287 05/12/2008   Chemistry (most recent): Lab Results  Component Value Date   NA 144 12/13/2015   K 4.5 12/13/2015   CL 104 12/13/2015   CO2 25 12/13/2015   BUN 14 12/13/2015   CREATININE 0.67 12/13/2015   Diagnosis:  1.uncontrolled type 2 diabetes 2. Hyperlipidemia 3. Hypertension    Assessment & Plan:   1. Uncontrolled type 2 DM: Her  Recent a1c was  Higher at 8.5% increasing from  7.4%. But her EAG in the last 30 days is 138.   I have counseled the patient  to stay on diet management by adopting a carbohydrate restricted diet. Her body weight is appropriate at this time.  - I will continue Basaglar at 18 units  qhs, associated with monitoring of BG  BEFORE breakfast.  - She will not require prandial insulin at this time.   - I will continue Metformin 1gm po BID   -Target numbers for a1c, LDL, HDL, Triglycerides, waist circumference were discussed in detail.  2. HTN: Controlled. I advised her to continue enalapril and salt restriction. 3. Hyperlipidemia: I advised her to continue Zocor. I will add fasting lipid panel with her next lab test. 4. Chronic care management: -Patient is on ACEI and Statin medications and encouraged to continue to follow up  with Ophthalmology at least yearly or according to recommendations, and advised to stay away from smoking.  -Patient is advised to continue f/u with her PMD for primary care needs.   Follow up plan: Return in about 3 months (around 04/10/2016) for follow up with pre-visit labs, meter, and logs.

## 2016-04-03 LAB — HEMOGLOBIN A1C
Est. average glucose Bld gHb Est-mCnc: 160 mg/dL
HEMOGLOBIN A1C: 7.2 % — AB (ref 4.8–5.6)

## 2016-04-03 LAB — CMP14+EGFR
ALT: 22 IU/L (ref 0–32)
AST: 23 IU/L (ref 0–40)
Albumin/Globulin Ratio: 1.4 (ref 1.2–2.2)
Albumin: 4.2 g/dL (ref 3.5–5.5)
Alkaline Phosphatase: 80 IU/L (ref 39–117)
BUN/Creatinine Ratio: 20 (ref 9–23)
BUN: 13 mg/dL (ref 6–24)
Bilirubin Total: 0.4 mg/dL (ref 0.0–1.2)
CO2: 27 mmol/L (ref 18–29)
Calcium: 9.4 mg/dL (ref 8.7–10.2)
Chloride: 103 mmol/L (ref 96–106)
Creatinine, Ser: 0.64 mg/dL (ref 0.57–1.00)
GFR calc Af Amer: 118 mL/min/{1.73_m2} (ref >59)
GFR calc non Af Amer: 102 mL/min/{1.73_m2} (ref >59)
Globulin, Total: 2.9 g/dL (ref 1.5–4.5)
Glucose: 105 mg/dL — ABNORMAL HIGH (ref 65–99)
Potassium: 4.2 mmol/L (ref 3.5–5.2)
Sodium: 142 mmol/L (ref 134–144)
Total Protein: 7.1 g/dL (ref 6.0–8.5)

## 2016-04-03 LAB — LIPID PANEL
Chol/HDL Ratio: 2 ratio units (ref 0.0–4.4)
Cholesterol, Total: 168 mg/dL (ref 100–199)
HDL: 83 mg/dL (ref >39)
LDL Calculated: 76 mg/dL (ref 0–99)
Triglycerides: 44 mg/dL (ref 0–149)
VLDL Cholesterol Cal: 9 mg/dL (ref 5–40)

## 2016-04-10 ENCOUNTER — Ambulatory Visit (INDEPENDENT_AMBULATORY_CARE_PROVIDER_SITE_OTHER): Payer: 59 | Admitting: "Endocrinology

## 2016-04-10 ENCOUNTER — Encounter: Payer: Self-pay | Admitting: "Endocrinology

## 2016-04-10 VITALS — BP 122/64 | HR 62 | Resp 18 | Ht 67.0 in | Wt 159.0 lb

## 2016-04-10 DIAGNOSIS — E118 Type 2 diabetes mellitus with unspecified complications: Secondary | ICD-10-CM

## 2016-04-10 DIAGNOSIS — Z794 Long term (current) use of insulin: Secondary | ICD-10-CM | POA: Diagnosis not present

## 2016-04-10 DIAGNOSIS — E782 Mixed hyperlipidemia: Secondary | ICD-10-CM | POA: Diagnosis not present

## 2016-04-10 DIAGNOSIS — I10 Essential (primary) hypertension: Secondary | ICD-10-CM

## 2016-04-10 DIAGNOSIS — IMO0002 Reserved for concepts with insufficient information to code with codable children: Secondary | ICD-10-CM

## 2016-04-10 DIAGNOSIS — E1165 Type 2 diabetes mellitus with hyperglycemia: Secondary | ICD-10-CM

## 2016-04-10 MED ORDER — BASAGLAR KWIKPEN 100 UNIT/ML ~~LOC~~ SOPN: 18.0000 [IU] | PEN_INJECTOR | Freq: Every day | SUBCUTANEOUS | 2 refills | Status: DC

## 2016-04-10 MED ORDER — INSULIN PEN NEEDLE 32G X 4 MM MISC: 1.0000 | Freq: Four times a day (QID) | 5 refills | Status: DC

## 2016-04-10 NOTE — Progress Notes (Signed)
Subjective:    Patient ID: Vicki Ward, female    DOB: [birth year], 55 y.o.   MRN: [id number]   Vicki Ward is a 55 y.o. female presenting on 04/10/2016 for Diabetes (2 with insulin use )     Diabetes  She presents for her follow-up diabetic visit. She has type 2 (with recent a1c of 7.4% , generally improved from 8.6%) diabetes mellitus. The initial diagnosis of diabetes was made 8 years ago. Her disease course has been improving. There are no hypoglycemic associated symptoms. Pertinent negatives for hypoglycemia include no headaches or pallor. Pertinent negatives for diabetes include no chest pain, no fatigue, no foot ulcerations, no polydipsia, no polyphagia, no polyuria and no weight loss. There are no hypoglycemic complications. Symptoms are improving. There are no diabetic complications. Risk factors for coronary artery disease include diabetes mellitus, dyslipidemia and hypertension. Current diabetic treatment includes insulin injections and oral agent (monotherapy). She is compliant with treatment most of the time. Her weight is stable. She is following a diabetic diet. Meal planning includes avoidance of concentrated sweets. She has had a previous visit with a dietitian. She participates in exercise intermittently. There is no change in her home blood glucose trend. Her breakfast blood glucose range is generally 140-180 mg/dl. Her dinner blood glucose range is generally 140-180 mg/dl. Her overall blood glucose range is 140-180 mg/dl. An ACE inhibitor/angiotensin II receptor blocker is being taken. Eye exam is current.  Hypertension  This is a chronic problem. Pertinent negatives include no chest pain, headaches, palpitations or shortness of breath. The current treatment provides moderate improvement. There are no compliance problems.   Hyperlipidemia  This is a chronic problem. The current episode started more than 1 year ago. The problem is controlled. Pertinent negatives include  no chest pain or shortness of breath. Current antihyperlipidemic treatment includes statins. The current treatment provides moderate improvement of lipids. There are no compliance problems.  Risk factors for coronary artery disease include diabetes mellitus, dyslipidemia and hypertension.      Review of Systems  Constitutional: Negative for fatigue and weight loss.  HENT: Negative for dental problem, mouth sores and trouble swallowing.   Eyes: Negative for visual disturbance.  Respiratory: Negative for cough and shortness of breath.   Cardiovascular: Negative for chest pain, palpitations and leg swelling.  Endocrine: Negative for polydipsia, polyphagia and polyuria.  Musculoskeletal: Negative for back pain and gait problem.  Skin: Negative for color change, pallor, rash and wound.  Neurological: Negative for headaches.       Objective:    BP 122/64   Pulse 62   Resp 18   Ht _0  (1.702 m)   Wt 159 lb (72.1 kg)   SpO2 99%   BMI 24.90 kg/m   Wt Readings from Last 3 Encounters:  04/10/16 159 lb (72.1 kg)  01/09/16 157 lb 12.8 oz (71.6 kg)  12/20/15 159 lb (72.1 kg)    Physical Exam  Constitutional: She is oriented to person, place, and time. She appears well-developed and well-nourished.  HENT:  Head: Normocephalic and atraumatic.  Eyes: EOM are normal.  Neck: Normal range of motion. Neck supple.  Cardiovascular: Normal rate and regular rhythm.   Pulmonary/Chest: Effort normal and breath sounds normal. No respiratory distress. She has no wheezes.  Abdominal: Soft. Bowel sounds are normal. She exhibits no distension. There is no guarding.  Musculoskeletal: Normal range of motion.  Neurological: She is alert and oriented to person, place, and time. She has  normal reflexes.  Skin: Skin is warm and dry. No rash noted. No erythema. No pallor.  Psychiatric: She has a normal mood and affect. Her behavior is normal. Judgment and thought content normal.     Results for orders  placed or performed in visit on 01/09/16  Lipid panel  Result Value Ref Range   Cholesterol, Total 168 100 - 199 mg/dL   Triglycerides 44 0 - 149 mg/dL   HDL 83 >39 mg/dL   VLDL Cholesterol Cal 9 5 - 40 mg/dL   LDL Calculated 76 0 - 99 mg/dL   Chol/HDL Ratio 2.0 0.0 - 4.4 ratio units  Hemoglobin A1c  Result Value Ref Range   Hgb A1c MFr Bld 7.2 (H) 4.8 - 5.6 %   Est. average glucose Bld gHb Est-mCnc 160 mg/dL  CMP14+EGFR  Result Value Ref Range   Glucose 105 (H) 65 - 99 mg/dL   BUN 13 6 - 24 mg/dL   Creatinine, Ser 0.64 0.57 - 1.00 mg/dL   GFR calc non Af Amer 102 >59 mL/min/1.73   GFR calc Af Amer 118 >59 mL/min/1.73   BUN/Creatinine Ratio 20 9 - 23   Sodium 142 134 - 144 mmol/L   Potassium 4.2 3.5 - 5.2 mmol/L   Chloride 103 96 - 106 mmol/L   CO2 27 18 - 29 mmol/L   Calcium 9.4 8.7 - 10.2 mg/dL   Total Protein 7.1 6.0 - 8.5 g/dL   Albumin 4.2 3.5 - 5.5 g/dL   Globulin, Total 2.9 1.5 - 4.5 g/dL   Albumin/Globulin Ratio 1.4 1.2 - 2.2   Bilirubin Total 0.4 0.0 - 1.2 mg/dL   Alkaline Phosphatase 80 39 - 117 IU/L   AST 23 0 - 40 IU/L   ALT 22 0 - 32 IU/L   Complete Blood Count (Most recent): Lab Results  Component Value Date   WBC 11.0 (H) 05/12/2008   HGB 13.1 05/12/2008   HCT 38.8 05/12/2008   MCV 94.3 05/12/2008   PLT 287 05/12/2008   Chemistry (most recent): Lab Results  Component Value Date   NA 142 04/02/2016   K 4.2 04/02/2016   CL 103 04/02/2016   CO2 27 04/02/2016   BUN 13 04/02/2016   CREATININE 0.64 04/02/2016   Diagnosis:  1.uncontrolled type 2 diabetes 2. Hyperlipidemia 3. Hypertension    Assessment & Plan:   1. Uncontrolled type 2 DM: Her  Recent a1c Is improved to 7.2% from 8.5%. I have counseled the patient  to stay on diet management by adopting a carbohydrate restricted diet. Her body weight is appropriate at this time.  - I will continue Basaglar at 18 units  qhs, associated with monitoring of BG  BEFORE breakfast. - She will not  require prandial insulin at this time.  - I will continue Metformin 1gm po BID   -Target numbers for a1c, LDL, HDL, Triglycerides, waist circumference were discussed in detail.   2. HTN: Controlled. I advised her to continue enalapril and salt restriction. 3. Hyperlipidemia: I advised her to continue Zocor. I will add fasting lipid panel with her next lab test. 4. Chronic care management: -Patient is on ACEI and Statin medications and encouraged to continue to follow up with Ophthalmology at least yearly or according to recommendations, and advised to stay away from smoking.  -Patient is advised to continue f/u with her PMD for primary care needs.   Follow up plan: Return in about 3 months (around 07/11/2016) for follow up with pre-visit labs,  meter, and logs.

## 2016-07-11 ENCOUNTER — Other Ambulatory Visit: Payer: Self-pay | Admitting: "Endocrinology

## 2016-07-16 ENCOUNTER — Ambulatory Visit: Payer: 59 | Admitting: "Endocrinology

## 2016-07-22 ENCOUNTER — Other Ambulatory Visit: Payer: Self-pay | Admitting: "Endocrinology

## 2016-07-22 DIAGNOSIS — Z794 Long term (current) use of insulin: Secondary | ICD-10-CM | POA: Diagnosis not present

## 2016-07-22 DIAGNOSIS — E1165 Type 2 diabetes mellitus with hyperglycemia: Secondary | ICD-10-CM | POA: Diagnosis not present

## 2016-07-22 DIAGNOSIS — E118 Type 2 diabetes mellitus with unspecified complications: Secondary | ICD-10-CM | POA: Diagnosis not present

## 2016-07-23 LAB — COMPREHENSIVE METABOLIC PANEL
ALK PHOS: 82 IU/L (ref 39–117)
ALT: 16 IU/L (ref 0–32)
AST: 14 IU/L (ref 0–40)
Albumin/Globulin Ratio: 1.4 (ref 1.2–2.2)
Albumin: 4.2 g/dL (ref 3.5–5.5)
BUN/Creatinine Ratio: 16 (ref 9–23)
BUN: 10 mg/dL (ref 6–24)
Bilirubin Total: 0.4 mg/dL (ref 0.0–1.2)
CO2: 27 mmol/L (ref 18–29)
CREATININE: 0.61 mg/dL (ref 0.57–1.00)
Calcium: 9.4 mg/dL (ref 8.7–10.2)
Chloride: 101 mmol/L (ref 96–106)
GFR calc Af Amer: 119 mL/min/{1.73_m2} (ref >59)
GFR calc non Af Amer: 103 mL/min/{1.73_m2} (ref >59)
GLOBULIN, TOTAL: 3 g/dL (ref 1.5–4.5)
GLUCOSE: 105 mg/dL — AB (ref 65–99)
Potassium: 4.2 mmol/L (ref 3.5–5.2)
SODIUM: 143 mmol/L (ref 134–144)
Total Protein: 7.2 g/dL (ref 6.0–8.5)

## 2016-07-23 LAB — HGB A1C W/O EAG: HEMOGLOBIN A1C: 7.9 % — AB (ref 4.8–5.6)

## 2016-07-23 LAB — AMBIG ABBREV CMP14 DEFAULT

## 2016-07-30 ENCOUNTER — Ambulatory Visit (INDEPENDENT_AMBULATORY_CARE_PROVIDER_SITE_OTHER): Payer: 59 | Admitting: "Endocrinology

## 2016-07-30 ENCOUNTER — Encounter: Payer: Self-pay | Admitting: "Endocrinology

## 2016-07-30 VITALS — BP 129/79 | HR 79 | Ht 67.0 in | Wt 154.0 lb

## 2016-07-30 DIAGNOSIS — E1165 Type 2 diabetes mellitus with hyperglycemia: Secondary | ICD-10-CM | POA: Diagnosis not present

## 2016-07-30 DIAGNOSIS — E118 Type 2 diabetes mellitus with unspecified complications: Secondary | ICD-10-CM

## 2016-07-30 DIAGNOSIS — E782 Mixed hyperlipidemia: Secondary | ICD-10-CM

## 2016-07-30 DIAGNOSIS — Z794 Long term (current) use of insulin: Secondary | ICD-10-CM | POA: Diagnosis not present

## 2016-07-30 DIAGNOSIS — IMO0002 Reserved for concepts with insufficient information to code with codable children: Secondary | ICD-10-CM

## 2016-07-30 DIAGNOSIS — I10 Essential (primary) hypertension: Secondary | ICD-10-CM | POA: Diagnosis not present

## 2016-07-30 NOTE — Progress Notes (Signed)
Subjective:    Patient ID: KALEESHA KALLAL, female    DOB: 14-Feb-1961, 56 y.o.   MRN: [id number]   Vicki Ward is a 56 y.o. female presenting on 07/30/2016 for Follow-up (DM type 2)     Diabetes  She presents for her follow-up diabetic visit. She has type 2 (generally improved from 8.6%) diabetes mellitus. Onset time: She was diagnosed at approximate age of 65 years. Her disease course has been worsening. There are no hypoglycemic associated symptoms. Pertinent negatives for hypoglycemia include no headaches or pallor. Pertinent negatives for diabetes include no chest pain, no fatigue, no foot ulcerations, no polydipsia, no polyphagia, no polyuria and no weight loss. There are no hypoglycemic complications. Symptoms are worsening. There are no diabetic complications. Risk factors for coronary artery disease include diabetes mellitus, dyslipidemia and hypertension. Current diabetic treatment includes insulin injections and oral agent (monotherapy). She is compliant with treatment most of the time. Her weight is decreasing steadily. She is following a diabetic diet. Meal planning includes avoidance of concentrated sweets. She has had a previous visit with a dietitian. She participates in exercise intermittently. There is no change in her home blood glucose trend. Her breakfast blood glucose range is generally 140-180 mg/dl. Her dinner blood glucose range is generally 140-180 mg/dl. Her overall blood glucose range is 140-180 mg/dl. An ACE inhibitor/angiotensin II receptor blocker is being taken. Eye exam is current.  Hypertension  This is a chronic problem. Pertinent negatives include no chest pain, headaches, palpitations or shortness of breath. The current treatment provides moderate improvement. There are no compliance problems.   Hyperlipidemia  This is a chronic problem. The current episode started more than 1 year ago. The problem is controlled. Pertinent negatives include no chest pain or  shortness of breath. Current antihyperlipidemic treatment includes statins. The current treatment provides moderate improvement of lipids. There are no compliance problems.  Risk factors for coronary artery disease include diabetes mellitus, dyslipidemia and hypertension.      Review of Systems  Constitutional: Negative for fatigue and weight loss.  HENT: Negative for dental problem, mouth sores and trouble swallowing.   Eyes: Negative for visual disturbance.  Respiratory: Negative for cough and shortness of breath.   Cardiovascular: Negative for chest pain, palpitations and leg swelling.  Endocrine: Negative for polydipsia, polyphagia and polyuria.  Musculoskeletal: Negative for back pain and gait problem.  Skin: Negative for color change, pallor, rash and wound.  Neurological: Negative for headaches.       Objective:    BP 129/79   Pulse 79   Ht 5\' 7"  (1.702 m)   Wt 154 lb (69.9 kg)   BMI 24.12 kg/m   Wt Readings from Last 3 Encounters:  07/30/16 154 lb (69.9 kg)  04/10/16 159 lb (72.1 kg)  01/09/16 157 lb 12.8 oz (71.6 kg)    Physical Exam  Constitutional: She is oriented to person, place, and time. She appears well-developed and well-nourished.  HENT:  Head: Normocephalic and atraumatic.  Eyes: EOM are normal.  Neck: Normal range of motion. Neck supple.  Cardiovascular: Normal rate and regular rhythm.   Pulmonary/Chest: Effort normal and breath sounds normal. No respiratory distress. She has no wheezes.  Abdominal: Soft. Bowel sounds are normal. She exhibits no distension. There is no guarding.  Musculoskeletal: Normal range of motion.  Neurological: She is alert and oriented to person, place, and time. She has normal reflexes.  Skin: Skin is warm and dry. No rash noted. No erythema.  No pallor.  Psychiatric: She has a normal mood and affect. Her behavior is normal. Judgment and thought content normal.     Results for orders placed or performed in visit on 07/22/16   Comprehensive metabolic panel  Result Value Ref Range   Glucose 105 (H) 65 - 99 mg/dL   BUN 10 6 - 24 mg/dL   Creatinine, Ser 0.61 0.57 - 1.00 mg/dL   GFR calc non Af Amer 103 >59 mL/min/1.73   GFR calc Af Amer 119 >59 mL/min/1.73   BUN/Creatinine Ratio 16 9 - 23   Sodium 143 134 - 144 mmol/L   Potassium 4.2 3.5 - 5.2 mmol/L   Chloride 101 96 - 106 mmol/L   CO2 27 18 - 29 mmol/L   Calcium 9.4 8.7 - 10.2 mg/dL   Total Protein 7.2 6.0 - 8.5 g/dL   Albumin 4.2 3.5 - 5.5 g/dL   Globulin, Total 3.0 1.5 - 4.5 g/dL   Albumin/Globulin Ratio 1.4 1.2 - 2.2   Bilirubin Total 0.4 0.0 - 1.2 mg/dL   Alkaline Phosphatase 82 39 - 117 IU/L   AST 14 0 - 40 IU/L   ALT 16 0 - 32 IU/L  Hgb A1c w/o eAG  Result Value Ref Range   Hgb A1c MFr Bld 7.9 (H) 4.8 - 5.6 %  Ambig Abbrev CMP14 Default  Result Value Ref Range   Ambig Abbrev CMP14 Default Comment    Complete Blood Count (Most recent): Lab Results  Component Value Date   WBC 11.0 (H) 05/12/2008   HGB 13.1 05/12/2008   HCT 38.8 05/12/2008   MCV 94.3 05/12/2008   PLT 287 05/12/2008   Chemistry (most recent): Lab Results  Component Value Date   NA 143 07/22/2016   K 4.2 07/22/2016   CL 101 07/22/2016   CO2 27 07/22/2016   BUN 10 07/22/2016   CREATININE 0.61 07/22/2016   Diagnosis:  1.uncontrolled type 2 diabetes 2. Hyperlipidemia 3. Hypertension    Assessment & Plan:   1. Uncontrolled type 2 DM: Her  Recent a1c Is Increased to 7.9% from 7.2%.  have counseled the patient  to stay on diet management by adopting a carbohydrate restricted diet. Her body weight is appropriate at this time.  - I will increase Basaglar to 20 units  qhs, associated with monitoring of BG  BEFORE breakfast and at bedtime. - She will not require prandial insulin at this time.  - I will continue Metformin 1gm po BID   -Target numbers for a1c, LDL, HDL, Triglycerides, waist circumference were discussed in detail.   2. HTN: Controlled. I advised her to  continue enalapril and salt restriction. 3. Hyperlipidemia: I advised her to continue Zocor. Uncontrolled with LDL 76 and HDL 83. I will add fasting lipid panel with her next lab test.  4. Chronic care management: -Patient is on ACEI and Statin medications and encouraged to continue to follow up with Ophthalmology at least yearly or according to recommendations, and advised to stay away from smoking.  -Patient is advised to continue f/u with her PMD for primary care needs.   Follow up plan: Return in about 3 months (around 10/27/2016) for meter, and logs.

## 2016-10-24 DIAGNOSIS — E1165 Type 2 diabetes mellitus with hyperglycemia: Secondary | ICD-10-CM | POA: Diagnosis not present

## 2016-10-24 DIAGNOSIS — E118 Type 2 diabetes mellitus with unspecified complications: Secondary | ICD-10-CM | POA: Diagnosis not present

## 2016-10-24 DIAGNOSIS — Z794 Long term (current) use of insulin: Secondary | ICD-10-CM | POA: Diagnosis not present

## 2016-10-25 LAB — CMP14+EGFR
A/G RATIO: 1.5 (ref 1.2–2.2)
ALBUMIN: 4.3 g/dL (ref 3.5–5.5)
ALK PHOS: 87 IU/L (ref 39–117)
ALT: 20 IU/L (ref 0–32)
AST: 18 IU/L (ref 0–40)
BUN / CREAT RATIO: 13 (ref 9–23)
BUN: 9 mg/dL (ref 6–24)
Bilirubin Total: 0.5 mg/dL (ref 0.0–1.2)
CO2: 28 mmol/L (ref 18–29)
Calcium: 9.8 mg/dL (ref 8.7–10.2)
Chloride: 100 mmol/L (ref 96–106)
Creatinine, Ser: 0.68 mg/dL (ref 0.57–1.00)
GFR calc Af Amer: 115 mL/min/{1.73_m2} (ref >59)
GFR, EST NON AFRICAN AMERICAN: 99 mL/min/{1.73_m2} (ref >59)
GLOBULIN, TOTAL: 2.8 g/dL (ref 1.5–4.5)
Glucose: 127 mg/dL — ABNORMAL HIGH (ref 65–99)
POTASSIUM: 4.4 mmol/L (ref 3.5–5.2)
SODIUM: 141 mmol/L (ref 134–144)
Total Protein: 7.1 g/dL (ref 6.0–8.5)

## 2016-10-25 LAB — HEMOGLOBIN A1C
Est. average glucose Bld gHb Est-mCnc: 177 mg/dL
Hgb A1c MFr Bld: 7.8 % — ABNORMAL HIGH (ref 4.8–5.6)

## 2016-10-27 ENCOUNTER — Other Ambulatory Visit: Payer: Self-pay | Admitting: "Endocrinology

## 2016-10-31 ENCOUNTER — Ambulatory Visit (INDEPENDENT_AMBULATORY_CARE_PROVIDER_SITE_OTHER): Payer: 59 | Admitting: "Endocrinology

## 2016-10-31 ENCOUNTER — Encounter: Payer: Self-pay | Admitting: "Endocrinology

## 2016-10-31 VITALS — BP 121/75 | HR 68 | Ht 67.0 in | Wt 158.0 lb

## 2016-10-31 DIAGNOSIS — I10 Essential (primary) hypertension: Secondary | ICD-10-CM

## 2016-10-31 DIAGNOSIS — E118 Type 2 diabetes mellitus with unspecified complications: Secondary | ICD-10-CM | POA: Diagnosis not present

## 2016-10-31 DIAGNOSIS — E1165 Type 2 diabetes mellitus with hyperglycemia: Secondary | ICD-10-CM

## 2016-10-31 DIAGNOSIS — E782 Mixed hyperlipidemia: Secondary | ICD-10-CM | POA: Diagnosis not present

## 2016-10-31 DIAGNOSIS — IMO0002 Reserved for concepts with insufficient information to code with codable children: Secondary | ICD-10-CM

## 2016-10-31 DIAGNOSIS — Z794 Long term (current) use of insulin: Secondary | ICD-10-CM

## 2016-10-31 MED ORDER — BASAGLAR KWIKPEN 100 UNIT/ML ~~LOC~~ SOPN: 24.0000 [IU] | PEN_INJECTOR | Freq: Every day | SUBCUTANEOUS | 2 refills | Status: DC

## 2016-10-31 NOTE — Progress Notes (Signed)
Subjective:    Patient ID: BRIANY AYE, female    DOB: 07/13/1960, 56 y.o.   MRN: [id number]   Vicki Ward is a 56 y.o. female presenting on 10/31/2016 for Follow-up (DM type 2)     Diabetes  She presents for her follow-up diabetic visit. She has type 2 (generally improved from 8.6%) diabetes mellitus. Onset time: She was diagnosed at approximate age of 69 years. Her disease course has been improving. There are no hypoglycemic associated symptoms. Pertinent negatives for hypoglycemia include no headaches or pallor. Pertinent negatives for diabetes include no chest pain, no fatigue, no foot ulcerations, no polydipsia, no polyphagia, no polyuria and no weight loss. There are no hypoglycemic complications. Symptoms are improving. There are no diabetic complications. Risk factors for coronary artery disease include diabetes mellitus, dyslipidemia and hypertension. Current diabetic treatment includes insulin injections and oral agent (monotherapy). She is compliant with treatment most of the time. Her weight is decreasing steadily. She is following a diabetic diet. Meal planning includes avoidance of concentrated sweets. She has had a previous visit with a dietitian. She participates in exercise intermittently. There is no change in her home blood glucose trend. Her breakfast blood glucose range is generally 140-180 mg/dl. Her dinner blood glucose range is generally 140-180 mg/dl. Her overall blood glucose range is 140-180 mg/dl. An ACE inhibitor/angiotensin II receptor blocker is being taken. Eye exam is current.  Hypertension  This is a chronic problem. Pertinent negatives include no chest pain, headaches, palpitations or shortness of breath. The current treatment provides moderate improvement. There are no compliance problems.   Hyperlipidemia  This is a chronic problem. The current episode started more than 1 year ago. The problem is controlled. Pertinent negatives include no chest pain or  shortness of breath. Current antihyperlipidemic treatment includes statins. The current treatment provides moderate improvement of lipids. There are no compliance problems.  Risk factors for coronary artery disease include diabetes mellitus, dyslipidemia and hypertension.      Review of Systems  Constitutional: Negative for fatigue and weight loss.  HENT: Negative for dental problem, mouth sores and trouble swallowing.   Eyes: Negative for visual disturbance.  Respiratory: Negative for cough and shortness of breath.   Cardiovascular: Negative for chest pain, palpitations and leg swelling.  Endocrine: Negative for polydipsia, polyphagia and polyuria.  Musculoskeletal: Negative for back pain and gait problem.  Skin: Negative for color change, pallor, rash and wound.  Neurological: Negative for headaches.       Objective:    BP 121/75   Pulse 68   Ht '5\' 7"'  (1.702 m)   Wt 158 lb (71.7 kg)   BMI 24.75 kg/m   Wt Readings from Last 3 Encounters:  10/31/16 158 lb (71.7 kg)  07/30/16 154 lb (69.9 kg)  04/10/16 159 lb (72.1 kg)    Physical Exam  Constitutional: She is oriented to person, place, and time. She appears well-developed and well-nourished.  HENT:  Head: Normocephalic and atraumatic.  Eyes: EOM are normal.  Neck: Normal range of motion. Neck supple.  Cardiovascular: Normal rate and regular rhythm.   Pulmonary/Chest: Effort normal and breath sounds normal. No respiratory distress. She has no wheezes.  Abdominal: Soft. Bowel sounds are normal. She exhibits no distension. There is no guarding.  Musculoskeletal: Normal range of motion.  Neurological: She is alert and oriented to person, place, and time. She has normal reflexes.  Skin: Skin is warm and dry. No rash noted. No erythema. No pallor.  Psychiatric: She has a normal mood and affect. Her behavior is normal. Judgment and thought content normal.     Results for orders placed or performed in visit on 07/30/16   CMP14+EGFR  Result Value Ref Range   Glucose 127 (H) 65 - 99 mg/dL   BUN 9 6 - 24 mg/dL   Creatinine, Ser 0.68 0.57 - 1.00 mg/dL   GFR calc non Af Amer 99 >59 mL/min/1.73   GFR calc Af Amer 115 >59 mL/min/1.73   BUN/Creatinine Ratio 13 9 - 23   Sodium 141 134 - 144 mmol/L   Potassium 4.4 3.5 - 5.2 mmol/L   Chloride 100 96 - 106 mmol/L   CO2 28 18 - 29 mmol/L   Calcium 9.8 8.7 - 10.2 mg/dL   Total Protein 7.1 6.0 - 8.5 g/dL   Albumin 4.3 3.5 - 5.5 g/dL   Globulin, Total 2.8 1.5 - 4.5 g/dL   Albumin/Globulin Ratio 1.5 1.2 - 2.2   Bilirubin Total 0.5 0.0 - 1.2 mg/dL   Alkaline Phosphatase 87 39 - 117 IU/L   AST 18 0 - 40 IU/L   ALT 20 0 - 32 IU/L  Hemoglobin A1c  Result Value Ref Range   Hgb A1c MFr Bld 7.8 (H) 4.8 - 5.6 %   Est. average glucose Bld gHb Est-mCnc 177 mg/dL   Complete Blood Count (Most recent): Lab Results  Component Value Date   WBC 11.0 (H) 05/12/2008   HGB 13.1 05/12/2008   HCT 38.8 05/12/2008   MCV 94.3 05/12/2008   PLT 287 05/12/2008   Chemistry (most recent): Lab Results  Component Value Date   NA 141 10/24/2016   K 4.4 10/24/2016   CL 100 10/24/2016   CO2 28 10/24/2016   BUN 9 10/24/2016   CREATININE 0.68 10/24/2016   Diagnosis:  1.uncontrolled type 2 diabetes 2. Hyperlipidemia 3. Hypertension    Assessment & Plan:   1. Uncontrolled type 2 DM: Her  Recent a1c is  Stable at 7.8%.  I have counseled the patient  to stay on diet management by adopting a carbohydrate restricted diet. Her body weight is appropriate at this time.  - I will increase Basaglar to 24 units  qhs, associated with monitoring of BG  BEFORE breakfast and at bedtime. - She will not require prandial insulin at this time.  - I will continue Metformin 1gm po BID   -Target numbers for a1c, LDL, HDL, Triglycerides, waist circumference were discussed in detail.   2. HTN: Controlled. I advised her to continue enalapril and salt restriction. 3. Hyperlipidemia: I advised her  to continue Zocor. Uncontrolled with LDL 76 and HDL 83. I will add fasting lipid panel with her next lab test.  4. Chronic care management: -Patient is on ACEI and Statin medications and encouraged to continue to follow up with Ophthalmology at least yearly or according to recommendations, and advised to stay away from smoking.  -Patient is advised to continue f/u with her PMD for primary care needs.   Follow up plan: Return in about 3 months (around 01/31/2017) for follow up with pre-visit labs, meter, and logs.

## 2017-01-02 DIAGNOSIS — B354 Tinea corporis: Secondary | ICD-10-CM | POA: Diagnosis not present

## 2017-01-02 DIAGNOSIS — K219 Gastro-esophageal reflux disease without esophagitis: Secondary | ICD-10-CM | POA: Diagnosis not present

## 2017-01-02 DIAGNOSIS — I10 Essential (primary) hypertension: Secondary | ICD-10-CM | POA: Diagnosis not present

## 2017-01-23 DIAGNOSIS — Z794 Long term (current) use of insulin: Secondary | ICD-10-CM | POA: Diagnosis not present

## 2017-01-23 DIAGNOSIS — E1165 Type 2 diabetes mellitus with hyperglycemia: Secondary | ICD-10-CM | POA: Diagnosis not present

## 2017-01-23 DIAGNOSIS — E118 Type 2 diabetes mellitus with unspecified complications: Secondary | ICD-10-CM | POA: Diagnosis not present

## 2017-01-24 LAB — CMP14+EGFR
ALBUMIN: 4.3 g/dL (ref 3.5–5.5)
ALT: 20 IU/L (ref 0–32)
AST: 22 IU/L (ref 0–40)
Albumin/Globulin Ratio: 1.4 (ref 1.2–2.2)
Alkaline Phosphatase: 82 IU/L (ref 39–117)
BUN / CREAT RATIO: 24 — AB (ref 9–23)
BUN: 19 mg/dL (ref 6–24)
Bilirubin Total: 0.3 mg/dL (ref 0.0–1.2)
CALCIUM: 9.7 mg/dL (ref 8.7–10.2)
CO2: 26 mmol/L (ref 20–29)
CREATININE: 0.8 mg/dL (ref 0.57–1.00)
Chloride: 99 mmol/L (ref 96–106)
GFR, EST AFRICAN AMERICAN: 97 mL/min/{1.73_m2} (ref >59)
GFR, EST NON AFRICAN AMERICAN: 84 mL/min/{1.73_m2} (ref >59)
GLUCOSE: 180 mg/dL — AB (ref 65–99)
Globulin, Total: 3 g/dL (ref 1.5–4.5)
Potassium: 4.4 mmol/L (ref 3.5–5.2)
Sodium: 139 mmol/L (ref 134–144)
TOTAL PROTEIN: 7.3 g/dL (ref 6.0–8.5)

## 2017-01-30 ENCOUNTER — Ambulatory Visit (INDEPENDENT_AMBULATORY_CARE_PROVIDER_SITE_OTHER): Payer: 59 | Admitting: "Endocrinology

## 2017-01-30 ENCOUNTER — Encounter: Payer: Self-pay | Admitting: "Endocrinology

## 2017-01-30 VITALS — BP 102/65 | HR 60 | Ht 67.0 in | Wt 156.0 lb

## 2017-01-30 DIAGNOSIS — E1165 Type 2 diabetes mellitus with hyperglycemia: Secondary | ICD-10-CM

## 2017-01-30 DIAGNOSIS — Z794 Long term (current) use of insulin: Secondary | ICD-10-CM

## 2017-01-30 DIAGNOSIS — E118 Type 2 diabetes mellitus with unspecified complications: Secondary | ICD-10-CM | POA: Diagnosis not present

## 2017-01-30 DIAGNOSIS — E782 Mixed hyperlipidemia: Secondary | ICD-10-CM | POA: Diagnosis not present

## 2017-01-30 DIAGNOSIS — I10 Essential (primary) hypertension: Secondary | ICD-10-CM | POA: Diagnosis not present

## 2017-01-30 DIAGNOSIS — IMO0002 Reserved for concepts with insufficient information to code with codable children: Secondary | ICD-10-CM

## 2017-01-30 MED ORDER — BASAGLAR KWIKPEN 100 UNIT/ML ~~LOC~~ SOPN: 30.0000 [IU] | PEN_INJECTOR | Freq: Every day | SUBCUTANEOUS | 2 refills | Status: DC

## 2017-01-30 NOTE — Progress Notes (Signed)
Subjective:    Patient ID: Vicki Ward, female    DOB: 07/13/1960, 56 y.o.   MRN: [id number]   Vicki Ward is a 56 y.o. female presenting on 01/30/2017 for Follow-up (DM type 2)     Diabetes  She presents for her follow-up diabetic visit. She has type 2 (generally improved from 8.6%) diabetes mellitus. Onset time: She was diagnosed at approximate age of 33 years. Her disease course has been stable. There are no hypoglycemic associated symptoms. Pertinent negatives for hypoglycemia include no headaches or pallor. Pertinent negatives for diabetes include no chest pain, no fatigue, no foot ulcerations, no polyphagia and no weight loss. There are no hypoglycemic complications. Symptoms are stable. There are no diabetic complications. Risk factors for coronary artery disease include diabetes mellitus, dyslipidemia and hypertension. Current diabetic treatment includes insulin injections and oral agent (monotherapy). She is compliant with treatment most of the time. Her weight is stable. She is following a diabetic diet. Meal planning includes avoidance of concentrated sweets. She has had a previous visit with a dietitian. She participates in exercise intermittently. There is no change in her home blood glucose trend. Her breakfast blood glucose range is generally 140-180 mg/dl. Her dinner blood glucose range is generally 140-180 mg/dl. Her overall blood glucose range is 140-180 mg/dl. An ACE inhibitor/angiotensin II receptor blocker is being taken. Eye exam is current.  Hypertension  This is a chronic problem. Pertinent negatives include no chest pain, headaches, palpitations or shortness of breath. The current treatment provides moderate improvement. There are no compliance problems.   Hyperlipidemia  This is a chronic problem. The current episode started more than 1 year ago. The problem is controlled. Pertinent negatives include no chest pain or shortness of breath. Current antihyperlipidemic  treatment includes statins. The current treatment provides moderate improvement of lipids. There are no compliance problems.  Risk factors for coronary artery disease include diabetes mellitus, dyslipidemia and hypertension.    Review of Systems  Constitutional: Negative for fatigue and weight loss.  HENT: Negative for dental problem, mouth sores and trouble swallowing.   Eyes: Negative for visual disturbance.  Respiratory: Negative for cough and shortness of breath.   Cardiovascular: Negative for chest pain, palpitations and leg swelling.  Endocrine: Negative for polyphagia.  Musculoskeletal: Negative for back pain and gait problem.  Skin: Negative for color change, pallor, rash and wound.  Neurological: Negative for headaches.      Objective:    BP 102/65   Pulse 60   Ht _0  (1.702 m)   Wt 156 lb (70.8 kg)   BMI 24.43 kg/m   Wt Readings from Last 3 Encounters:  01/30/17 156 lb (70.8 kg)  10/31/16 158 lb (71.7 kg)  07/30/16 154 lb (69.9 kg)    Physical Exam  Constitutional: She is oriented to person, place, and time. She appears well-developed and well-nourished.  HENT:  Head: Normocephalic and atraumatic.  Eyes: EOM are normal.  Neck: Normal range of motion. Neck supple.  Cardiovascular: Normal rate and regular rhythm.   Pulmonary/Chest: Effort normal and breath sounds normal. No respiratory distress. She has no wheezes.  Abdominal: Soft. Bowel sounds are normal. She exhibits no distension. There is no guarding.  Musculoskeletal: Normal range of motion.  Neurological: She is alert and oriented to person, place, and time. She has normal reflexes.  Skin: Skin is warm and dry. No rash noted. No erythema. No pallor.  Psychiatric: She has a normal mood and affect. Her behavior  is normal. Judgment and thought content normal.     Results for orders placed or performed in visit on 10/31/16  CMP14+EGFR  Result Value Ref Range   Glucose 180 (H) 65 - 99 mg/dL   BUN 19 6 - 24  mg/dL   Creatinine, Ser 0.80 0.57 - 1.00 mg/dL   GFR calc non Af Amer 84 >59 mL/min/1.73   GFR calc Af Amer 97 >59 mL/min/1.73   BUN/Creatinine Ratio 24 (H) 9 - 23   Sodium 139 134 - 144 mmol/L   Potassium 4.4 3.5 - 5.2 mmol/L   Chloride 99 96 - 106 mmol/L   CO2 26 20 - 29 mmol/L   Calcium 9.7 8.7 - 10.2 mg/dL   Total Protein 7.3 6.0 - 8.5 g/dL   Albumin 4.3 3.5 - 5.5 g/dL   Globulin, Total 3.0 1.5 - 4.5 g/dL   Albumin/Globulin Ratio 1.4 1.2 - 2.2   Bilirubin Total 0.3 0.0 - 1.2 mg/dL   Alkaline Phosphatase 82 39 - 117 IU/L   AST 22 0 - 40 IU/L   ALT 20 0 - 32 IU/L   Complete Blood Count (Most recent): Lab Results  Component Value Date   WBC 11.0 (H) 05/12/2008   HGB 13.1 05/12/2008   HCT 38.8 05/12/2008   MCV 94.3 05/12/2008   PLT 287 05/12/2008   Chemistry (most recent): Lab Results  Component Value Date   NA 139 01/23/2017   K 4.4 01/23/2017   CL 99 01/23/2017   CO2 26 01/23/2017   BUN 19 01/23/2017   CREATININE 0.80 01/23/2017   Diagnosis:  1. uncontrolled type 2 diabetes 2.  Hyperlipidemia 3. Hypertension    Assessment & Plan:   1. Uncontrolled type 2 DM: -She came with a meter showing controlled glycemia, slightly above target with no major hypoglycemia. Her  Recent  Labs from [facility] did not include A1c, during her last visit A1c was 7.8% stable.  I have counseled the patient  to stay on diet management by adopting a carbohydrate restricted diet. Her body weight is appropriate at this time.  - I will increase Basaglar to 30 units  qhs, associated with monitoring of blood glucose 2 times a day-before breakfast and at bedtime. - She will not require prandial insulin at this time.  - I will continue Metformin 1000 minute grams by mouth twice a day.  -Target numbers for a1c, LDL, HDL, Triglycerides, waist circumference were discussed in detail.   2. HTN: Controlled. I advised her to continue enalapril and salt restriction. 3. Hyperlipidemia: I advised her  to continue Zocor. Uncontrolled with LDL 76 and HDL 83. I will add fasting lipid panel with her next lab test.  4. Chronic care management: -Patient is on ACEI and Statin medications and encouraged to continue to follow up with Ophthalmology at least yearly or according to recommendations, and advised to stay away from smoking.  -Patient is advised to continue f/u with her PMD for primary care needs. - Time spent with the patient: 25 min, of which >50% was spent in reviewing her sugar logs , discussing her hypo- and hyper-glycemic episodes, reviewing current and  previous labs and insulin doses and developing a plan to avoid hypo- and hyper-glycemia.    Follow up plan: Return in about 3 months (around 05/02/2017) for meter, and logs.   Glade Lloyd, MD [facility]  This note was partially dictated with voice recognition software. Similar sounding words can be transcribed inadequately  or may not  be corrected upon review.

## 2017-01-30 NOTE — Patient Instructions (Signed)

## 2017-05-08 ENCOUNTER — Other Ambulatory Visit: Payer: Self-pay | Admitting: "Endocrinology

## 2017-05-08 DIAGNOSIS — Z794 Long term (current) use of insulin: Secondary | ICD-10-CM | POA: Diagnosis not present

## 2017-05-08 DIAGNOSIS — E782 Mixed hyperlipidemia: Secondary | ICD-10-CM | POA: Diagnosis not present

## 2017-05-08 DIAGNOSIS — E1165 Type 2 diabetes mellitus with hyperglycemia: Secondary | ICD-10-CM | POA: Diagnosis not present

## 2017-05-08 DIAGNOSIS — E118 Type 2 diabetes mellitus with unspecified complications: Secondary | ICD-10-CM | POA: Diagnosis not present

## 2017-05-09 LAB — RENAL FUNCTION PANEL
Albumin: 4.4 g/dL (ref 3.5–5.5)
BUN/Creatinine Ratio: 18 (ref 9–23)
BUN: 12 mg/dL (ref 6–24)
CO2: 27 mmol/L (ref 20–29)
Calcium: 9.6 mg/dL (ref 8.7–10.2)
Chloride: 103 mmol/L (ref 96–106)
Creatinine, Ser: 0.67 mg/dL (ref 0.57–1.00)
GFR calc Af Amer: 115 mL/min/{1.73_m2}
GFR calc non Af Amer: 100 mL/min/{1.73_m2}
Glucose: 94 mg/dL (ref 65–99)
Phosphorus: 4.6 mg/dL — ABNORMAL HIGH (ref 2.5–4.5)
Potassium: 4.1 mmol/L (ref 3.5–5.2)
Sodium: 142 mmol/L (ref 134–144)

## 2017-05-09 LAB — LIPID PANEL W/O CHOL/HDL RATIO
Cholesterol, Total: 169 mg/dL (ref 100–199)
HDL: 79 mg/dL
LDL Calculated: 79 mg/dL (ref 0–99)
Triglycerides: 54 mg/dL (ref 0–149)
VLDL Cholesterol Cal: 11 mg/dL (ref 5–40)

## 2017-05-09 LAB — HGB A1C W/O EAG: Hgb A1c MFr Bld: 8.3 % — ABNORMAL HIGH (ref 4.8–5.6)

## 2017-05-15 ENCOUNTER — Other Ambulatory Visit: Payer: Self-pay

## 2017-05-15 ENCOUNTER — Ambulatory Visit (INDEPENDENT_AMBULATORY_CARE_PROVIDER_SITE_OTHER): Payer: 59 | Admitting: "Endocrinology

## 2017-05-15 ENCOUNTER — Encounter: Payer: Self-pay | Admitting: "Endocrinology

## 2017-05-15 VITALS — BP 134/81 | HR 73 | Ht 67.0 in | Wt 164.0 lb

## 2017-05-15 DIAGNOSIS — E782 Mixed hyperlipidemia: Secondary | ICD-10-CM

## 2017-05-15 DIAGNOSIS — IMO0002 Reserved for concepts with insufficient information to code with codable children: Secondary | ICD-10-CM

## 2017-05-15 DIAGNOSIS — E1165 Type 2 diabetes mellitus with hyperglycemia: Secondary | ICD-10-CM | POA: Diagnosis not present

## 2017-05-15 DIAGNOSIS — E118 Type 2 diabetes mellitus with unspecified complications: Secondary | ICD-10-CM | POA: Diagnosis not present

## 2017-05-15 DIAGNOSIS — Z794 Long term (current) use of insulin: Secondary | ICD-10-CM

## 2017-05-15 DIAGNOSIS — I10 Essential (primary) hypertension: Secondary | ICD-10-CM | POA: Diagnosis not present

## 2017-05-15 MED ORDER — ONETOUCH VERIO VI STRP: ORAL_STRIP | 5 refills | Status: DC

## 2017-05-15 MED ORDER — BASAGLAR KWIKPEN 100 UNIT/ML ~~LOC~~ SOPN: 40.0000 [IU] | PEN_INJECTOR | Freq: Every day | SUBCUTANEOUS | 2 refills | Status: DC

## 2017-05-15 NOTE — Patient Instructions (Signed)

## 2017-05-15 NOTE — Progress Notes (Signed)
Subjective:    Patient ID: Vicki Ward, female    DOB: 02/05/1961, 56 y.o.   MRN: [id number]   Vicki Ward is a 56 y.o. female presenting on 05/15/2017 for Follow-up (DM type 2)     Diabetes  She presents for her follow-up diabetic visit. She has type 2 (generally improved from 8.6%) diabetes mellitus. Onset time: She was diagnosed at approximate age of 67 years. Her disease course has been stable. There are no hypoglycemic associated symptoms. Pertinent negatives for hypoglycemia include no headaches or pallor. Pertinent negatives for diabetes include no chest pain, no fatigue, no foot ulcerations, no polyphagia and no weight loss. There are no hypoglycemic complications. Symptoms are stable. There are no diabetic complications. Risk factors for coronary artery disease include diabetes mellitus, dyslipidemia and hypertension. Current diabetic treatment includes insulin injections and oral agent (monotherapy). She is compliant with treatment most of the time. Her weight is stable. She is following a diabetic diet. Meal planning includes avoidance of concentrated sweets. She has had a previous visit with a dietitian. She participates in exercise intermittently. There is no change in her home blood glucose trend. Her breakfast blood glucose range is generally 140-180 mg/dl. Her dinner blood glucose range is generally 140-180 mg/dl. Her overall blood glucose range is 140-180 mg/dl. An ACE inhibitor/angiotensin II receptor blocker is being taken. Eye exam is current.  Hypertension  This is a chronic problem. Pertinent negatives include no chest pain, headaches, palpitations or shortness of breath. The current treatment provides moderate improvement. There are no compliance problems.   Hyperlipidemia  This is a chronic problem. The current episode started more than 1 year ago. The problem is controlled. Pertinent negatives include no chest pain or shortness of breath. Current antihyperlipidemic  treatment includes statins. The current treatment provides moderate improvement of lipids. There are no compliance problems.  Risk factors for coronary artery disease include diabetes mellitus, dyslipidemia and hypertension.    Review of Systems  Constitutional: Negative for fatigue and weight loss.  HENT: Negative for dental problem, mouth sores and trouble swallowing.   Eyes: Negative for visual disturbance.  Respiratory: Negative for cough and shortness of breath.   Cardiovascular: Negative for chest pain, palpitations and leg swelling.  Endocrine: Negative for polyphagia.  Musculoskeletal: Negative for back pain and gait problem.  Skin: Negative for color change, pallor, rash and wound.  Neurological: Negative for headaches.      Objective:    BP 134/81   Pulse 73   Ht 5\' 7"  (1.702 m)   Wt 164 lb (74.4 kg)   BMI 25.69 kg/m   Wt Readings from Last 3 Encounters:  05/15/17 164 lb (74.4 kg)  01/30/17 156 lb (70.8 kg)  10/31/16 158 lb (71.7 kg)    Physical Exam  Constitutional: She is oriented to person, place, and time. She appears well-developed and well-nourished.  HENT:  Head: Normocephalic and atraumatic.  Eyes: EOM are normal.  Neck: Normal range of motion. Neck supple.  Cardiovascular: Normal rate and regular rhythm.  Pulmonary/Chest: Effort normal and breath sounds normal. No respiratory distress. She has no wheezes.  Abdominal: Soft. Bowel sounds are normal. She exhibits no distension. There is no guarding.  Musculoskeletal: Normal range of motion.  Neurological: She is alert and oriented to person, place, and time. She has normal reflexes.  Skin: Skin is warm and dry. No rash noted. No erythema. No pallor.  Psychiatric: She has a normal mood and affect. Her behavior is  normal. Judgment and thought content normal.     Results for orders placed or performed in visit on 05/08/17  Renal function panel  Result Value Ref Range   Glucose 94 65 - 99 mg/dL   BUN 12 6 -  24 mg/dL   Creatinine, Ser 0.67 0.57 - 1.00 mg/dL   GFR calc non Af Amer 100 >59 mL/min/1.73   GFR calc Af Amer 115 >59 mL/min/1.73   BUN/Creatinine Ratio 18 9 - 23   Sodium 142 134 - 144 mmol/L   Potassium 4.1 3.5 - 5.2 mmol/L   Chloride 103 96 - 106 mmol/L   CO2 27 20 - 29 mmol/L   Calcium 9.6 8.7 - 10.2 mg/dL   Phosphorus 4.6 (H) 2.5 - 4.5 mg/dL   Albumin 4.4 3.5 - 5.5 g/dL  Lipid Panel w/o Chol/HDL Ratio  Result Value Ref Range   Cholesterol, Total 169 100 - 199 mg/dL   Triglycerides 54 0 - 149 mg/dL   HDL 79 >39 mg/dL   VLDL Cholesterol Cal 11 5 - 40 mg/dL   LDL Calculated 79 0 - 99 mg/dL  Hgb A1c w/o eAG  Result Value Ref Range   Hgb A1c MFr Bld 8.3 (H) 4.8 - 5.6 %   Complete Blood Count (Most recent): Lab Results  Component Value Date   WBC 11.0 (H) 05/12/2008   HGB 13.1 05/12/2008   HCT 38.8 05/12/2008   MCV 94.3 05/12/2008   PLT 287 05/12/2008   Chemistry (most recent): Lab Results  Component Value Date   NA 142 05/08/2017   K 4.1 05/08/2017   CL 103 05/08/2017   CO2 27 05/08/2017   BUN 12 05/08/2017   CREATININE 0.67 05/08/2017   Diagnosis:  1. uncontrolled type 2 diabetes 2.  Hyperlipidemia 3. Hypertension    Assessment & Plan:   1. Uncontrolled type 2 DM: -She came with a meter showing significantly fluctuating blood glucose profile averaging 176 over the last 90 days, A1c increasing to 9.3% from 7.8%.  I have counseled the patient  to stay on diet management by adopting a carbohydrate restricted diet. Her body weight is appropriate at this time.  - I will increase Basaglar to 40 units  qhs, associated with monitoring of blood glucose 2 times a day-before breakfast and at bedtime. - She will not require prandial insulin at this time.  - I will continue Metformin 1000 mg by mouth twice a day.  -Target numbers for a1c, LDL, HDL, Triglycerides, waist circumference were discussed in detail.   2. HTN:  Controlled. I advised her to continue her  current medications including enalapril, and salt restriction.  3. Hyperlipidemia: I advised her to continue Zocor. Uncontrolled with LDL 76 and HDL 83. I will add fasting lipid panel with her next lab test.  4. Chronic care management: -Patient is on ACEI and Statin medications and encouraged to continue to follow up with Ophthalmology at least yearly or according to recommendations, and advised to stay away from smoking.  -Patient is advised to continue f/u with her PMD for primary care needs.  - Time spent with the patient: 25 min, of which >50% was spent in reviewing her sugar logs , discussing her hypo- and hyper-glycemic episodes, reviewing her current and  previous labs and insulin doses and developing a plan to avoid hypo- and hyper-glycemia.   Follow up plan: Return in about 3 months (around 08/13/2017) for meter, and logs, meter, and logs.   Glade Lloyd, MD [facility]  [facility]  This note was partially dictated with voice recognition software. Similar sounding words can be transcribed inadequately or may not  be corrected upon review.

## 2017-08-11 ENCOUNTER — Encounter: Payer: Self-pay | Admitting: Internal Medicine

## 2017-08-15 ENCOUNTER — Ambulatory Visit: Payer: 59 | Admitting: "Endocrinology

## 2017-08-26 ENCOUNTER — Other Ambulatory Visit: Payer: Self-pay | Admitting: "Endocrinology

## 2017-08-26 DIAGNOSIS — E1165 Type 2 diabetes mellitus with hyperglycemia: Secondary | ICD-10-CM | POA: Diagnosis not present

## 2017-08-26 DIAGNOSIS — Z794 Long term (current) use of insulin: Secondary | ICD-10-CM | POA: Diagnosis not present

## 2017-08-26 DIAGNOSIS — E118 Type 2 diabetes mellitus with unspecified complications: Secondary | ICD-10-CM | POA: Diagnosis not present

## 2017-08-27 LAB — COMPREHENSIVE METABOLIC PANEL
ALBUMIN: 4.4 g/dL (ref 3.5–5.5)
ALT: 21 IU/L (ref 0–32)
AST: 27 IU/L (ref 0–40)
Albumin/Globulin Ratio: 1.7 (ref 1.2–2.2)
Alkaline Phosphatase: 85 IU/L (ref 39–117)
BUN / CREAT RATIO: 24 — AB (ref 9–23)
BUN: 15 mg/dL (ref 6–24)
Bilirubin Total: 0.3 mg/dL (ref 0.0–1.2)
CALCIUM: 9.6 mg/dL (ref 8.7–10.2)
CO2: 24 mmol/L (ref 20–29)
CREATININE: 0.62 mg/dL (ref 0.57–1.00)
Chloride: 104 mmol/L (ref 96–106)
GFR, EST AFRICAN AMERICAN: 117 mL/min/{1.73_m2} (ref >59)
GFR, EST NON AFRICAN AMERICAN: 102 mL/min/{1.73_m2} (ref >59)
GLOBULIN, TOTAL: 2.6 g/dL (ref 1.5–4.5)
Glucose: 197 mg/dL — ABNORMAL HIGH (ref 65–99)
Potassium: 4.4 mmol/L (ref 3.5–5.2)
SODIUM: 143 mmol/L (ref 134–144)
Total Protein: 7 g/dL (ref 6.0–8.5)

## 2017-08-27 LAB — HGB A1C W/O EAG: Hgb A1c MFr Bld: 8.5 % — ABNORMAL HIGH (ref 4.8–5.6)

## 2017-08-27 LAB — SPECIMEN STATUS REPORT

## 2017-09-02 ENCOUNTER — Encounter: Payer: Self-pay | Admitting: "Endocrinology

## 2017-09-02 ENCOUNTER — Ambulatory Visit (INDEPENDENT_AMBULATORY_CARE_PROVIDER_SITE_OTHER): Payer: 59 | Admitting: "Endocrinology

## 2017-09-02 VITALS — BP 135/84 | HR 65 | Ht 67.0 in | Wt 162.0 lb

## 2017-09-02 DIAGNOSIS — E118 Type 2 diabetes mellitus with unspecified complications: Secondary | ICD-10-CM | POA: Diagnosis not present

## 2017-09-02 DIAGNOSIS — E1165 Type 2 diabetes mellitus with hyperglycemia: Secondary | ICD-10-CM | POA: Diagnosis not present

## 2017-09-02 DIAGNOSIS — Z794 Long term (current) use of insulin: Secondary | ICD-10-CM | POA: Diagnosis not present

## 2017-09-02 DIAGNOSIS — I10 Essential (primary) hypertension: Secondary | ICD-10-CM | POA: Diagnosis not present

## 2017-09-02 DIAGNOSIS — IMO0002 Reserved for concepts with insufficient information to code with codable children: Secondary | ICD-10-CM

## 2017-09-02 DIAGNOSIS — E782 Mixed hyperlipidemia: Secondary | ICD-10-CM

## 2017-09-02 MED ORDER — BASAGLAR KWIKPEN 100 UNIT/ML ~~LOC~~ SOPN: 30.0000 [IU] | PEN_INJECTOR | Freq: Every day | SUBCUTANEOUS | 2 refills | Status: DC

## 2017-09-02 MED ORDER — INSULIN ASPART 100 UNIT/ML FLEXPEN: 5.0000 [IU] | PEN_INJECTOR | Freq: Three times a day (TID) | SUBCUTANEOUS | 2 refills | Status: DC

## 2017-09-02 NOTE — Progress Notes (Signed)
Subjective:    Patient ID: Vicki Ward, female    DOB: [birth year], 57 y.o.   MRN: [id number]   Vicki Ward is a 57 y.o. female presenting on 09/02/2017 for Follow-up (DM type 2)    Diabetes  She presents for her follow-up diabetic visit. She has type 2 diabetes mellitus. Onset time: She was diagnosed at approximate age of 3 years. Her disease course has been worsening. There are no hypoglycemic associated symptoms. Pertinent negatives for hypoglycemia include no headaches or pallor. Pertinent negatives for diabetes include no chest pain, no fatigue, no foot ulcerations, no polyphagia and no weight loss. There are no hypoglycemic complications. Symptoms are worsening. There are no diabetic complications. Risk factors for coronary artery disease include diabetes mellitus, dyslipidemia and hypertension. Current diabetic treatment includes insulin injections and oral agent (monotherapy). She is compliant with treatment most of the time. Her weight is fluctuating minimally. She is following a diabetic diet. Meal planning includes avoidance of concentrated sweets. She has had a previous visit with a dietitian. She participates in exercise intermittently. There is no change in her home blood glucose trend. Her breakfast blood glucose range is generally 110-130 mg/dl. Her lunch blood glucose range is generally 140-180 mg/dl. Her dinner blood glucose range is generally 140-180 mg/dl. Her overall blood glucose range is 140-180 mg/dl. An ACE inhibitor/angiotensin II receptor blocker is being taken. Eye exam is current.  Hypertension  This is a chronic problem. Pertinent negatives include no chest pain, headaches, palpitations or shortness of breath. Risk factors for coronary artery disease include dyslipidemia, diabetes mellitus and sedentary lifestyle. Past treatments include ACE inhibitors. The current treatment provides moderate improvement. There are no compliance problems.   Hyperlipidemia  This  is a chronic problem. The current episode started more than 1 year ago. The problem is controlled. Pertinent negatives include no chest pain or shortness of breath. Current antihyperlipidemic treatment includes statins. The current treatment provides moderate improvement of lipids. There are no compliance problems.  Risk factors for coronary artery disease include diabetes mellitus, dyslipidemia and hypertension.    Review of Systems  Constitutional: Negative for fatigue and weight loss.  HENT: Negative for dental problem, mouth sores and trouble swallowing.   Eyes: Negative for visual disturbance.  Respiratory: Negative for cough and shortness of breath.   Cardiovascular: Negative for chest pain, palpitations and leg swelling.  Endocrine: Negative for polyphagia.  Musculoskeletal: Negative for back pain and gait problem.  Skin: Negative for color change, pallor, rash and wound.  Neurological: Negative for headaches.      Objective:    BP 135/84   Pulse 65   Ht 5\' 7"  (1.702 m)   Wt 162 lb (73.5 kg)   BMI 25.37 kg/m   Wt Readings from Last 3 Encounters:  09/02/17 162 lb (73.5 kg)  05/15/17 164 lb (74.4 kg)  01/30/17 156 lb (70.8 kg)    Physical Exam  Constitutional: She is oriented to person, place, and time. She appears well-developed and well-nourished.  HENT:  Head: Normocephalic and atraumatic.  Eyes: EOM are normal.  Neck: Normal range of motion. Neck supple.  Cardiovascular: Normal rate and regular rhythm.  Pulmonary/Chest: Effort normal and breath sounds normal. No respiratory distress. She has no wheezes.  Abdominal: Soft. Bowel sounds are normal. She exhibits no distension. There is no guarding.  Musculoskeletal: Normal range of motion.  Neurological: She is alert and oriented to person, place, and time. She has normal reflexes.  Skin: Skin  is warm and dry. No rash noted. No erythema. No pallor.  Psychiatric: She has a normal mood and affect. Her behavior is normal.  Judgment and thought content normal.     Results for orders placed or performed in visit on 08/26/17  Comprehensive metabolic panel  Result Value Ref Range   Glucose 197 (H) 65 - 99 mg/dL   BUN 15 6 - 24 mg/dL   Creatinine, Ser 0.62 0.57 - 1.00 mg/dL   GFR calc non Af Amer 102 >59 mL/min/1.73   GFR calc Af Amer 117 >59 mL/min/1.73   BUN/Creatinine Ratio 24 (H) 9 - 23   Sodium 143 134 - 144 mmol/L   Potassium 4.4 3.5 - 5.2 mmol/L   Chloride 104 96 - 106 mmol/L   CO2 24 20 - 29 mmol/L   Calcium 9.6 8.7 - 10.2 mg/dL   Total Protein 7.0 6.0 - 8.5 g/dL   Albumin 4.4 3.5 - 5.5 g/dL   Globulin, Total 2.6 1.5 - 4.5 g/dL   Albumin/Globulin Ratio 1.7 1.2 - 2.2   Bilirubin Total 0.3 0.0 - 1.2 mg/dL   Alkaline Phosphatase 85 39 - 117 IU/L   AST 27 0 - 40 IU/L   ALT 21 0 - 32 IU/L  Hgb A1c w/o eAG  Result Value Ref Range   Hgb A1c MFr Bld 8.5 (H) 4.8 - 5.6 %  Specimen status report  Result Value Ref Range   specimen status report Comment    Complete Blood Count (Most recent): Lab Results  Component Value Date   WBC 11.0 (H) 05/12/2008   HGB 13.1 05/12/2008   HCT 38.8 05/12/2008   MCV 94.3 05/12/2008   PLT 287 05/12/2008   Chemistry (most recent): Lab Results  Component Value Date   NA 143 08/26/2017   K 4.4 08/26/2017   CL 104 08/26/2017   CO2 24 08/26/2017   BUN 15 08/26/2017   CREATININE 0.62 08/26/2017   Diagnosis:  1. uncontrolled type 2 diabetes 2.  Hyperlipidemia 3. Hypertension    Assessment & Plan:   1. Uncontrolled type 2 DM: -She came with fluctuating blood glucose profile including rare and random fasting hypoglycemia to the range of 50s in the morning.  Her A1c is 8.5% unchanged from her last visit.   I have counseled the patient  to stay on diet management by adopting a carbohydrate restricted diet. Her body weight is appropriate at this time.  -She will not tolerate the current or any more dose of her basal insulin.   - I will decrease Basaglar to 30  units nightly, discussed and initiated prandial insulin Fiasp 5-11 3 times daily for meals for pre-meal blood glucose readings above 90 mg/dL.   -She is asked to monitor blood glucose 4 times a day-before meals and at bedtime. -She will benefit from continuous glucose monitoring.  She will be considered for the [facility] device during her next visit.   - I will continue Metformin 1000 mg by mouth twice a day.  -Target numbers for a1c, LDL, HDL, Triglycerides, waist circumference were discussed in detail.   2. HTN: Her blood pressure is controlled to target.  I advised her to continue her current medications including enalapril, and salt restriction.  3. Hyperlipidemia: I advised her to continue Zocor. Uncontrolled with LDL 76 and HDL 83. I will add fasting lipid panel with her next lab test.  4. Chronic care management: -Patient is on ACEI and Statin medications and encouraged to continue to  follow up with Ophthalmology at least yearly or according to recommendations, and advised to stay away from smoking.  -Patient is advised to continue f/u with her PMD for primary care needs.  - Time spent with the patient: 25 min, of which >50% was spent in reviewing her blood glucose logs , discussing her hypo- and hyper-glycemic episodes, reviewing her current and  previous labs and insulin doses and developing a plan to avoid hypo- and hyper-glycemia. Please refer to Patient Instructions for Blood Glucose Monitoring and Insulin/Medications Dosing Guide"  in media tab for additional information. [name] participated in the discussions, expressed understanding, and voiced agreement with the above plans.  All questions were answered to her satisfaction. she is encouraged to contact clinic should she have any questions or concerns prior to her return visit.   Follow up plan: Return in about 2 weeks (around 09/16/2017) for follow up with meter and logs- no labs.   Glade Lloyd, MD [facility]  This note was partially dictated with voice recognition software. Similar sounding words can be transcribed inadequately or may not  be corrected upon review.

## 2017-09-02 NOTE — Patient Instructions (Signed)

## 2017-09-05 ENCOUNTER — Other Ambulatory Visit: Payer: Self-pay | Admitting: "Endocrinology

## 2017-09-16 ENCOUNTER — Ambulatory Visit (INDEPENDENT_AMBULATORY_CARE_PROVIDER_SITE_OTHER): Payer: 59 | Admitting: "Endocrinology

## 2017-09-16 ENCOUNTER — Encounter: Payer: Self-pay | Admitting: "Endocrinology

## 2017-09-16 VITALS — BP 113/70 | HR 59 | Ht 67.0 in | Wt 160.0 lb

## 2017-09-16 DIAGNOSIS — Z794 Long term (current) use of insulin: Secondary | ICD-10-CM

## 2017-09-16 DIAGNOSIS — E1165 Type 2 diabetes mellitus with hyperglycemia: Secondary | ICD-10-CM

## 2017-09-16 DIAGNOSIS — E118 Type 2 diabetes mellitus with unspecified complications: Secondary | ICD-10-CM

## 2017-09-16 DIAGNOSIS — E782 Mixed hyperlipidemia: Secondary | ICD-10-CM

## 2017-09-16 DIAGNOSIS — IMO0002 Reserved for concepts with insufficient information to code with codable children: Secondary | ICD-10-CM

## 2017-09-16 DIAGNOSIS — I10 Essential (primary) hypertension: Secondary | ICD-10-CM

## 2017-09-16 MED ORDER — FREESTYLE LIBRE 14 DAY READER DEVI: 1.0000 | Freq: Once | 0 refills | Status: AC

## 2017-09-16 MED ORDER — FREESTYLE LIBRE 14 DAY SENSOR MISC: 1.0000 | 2 refills | Status: DC

## 2017-09-16 NOTE — Progress Notes (Signed)
Subjective:    Patient ID: Vicki Ward, female    DOB: February 11, 1961, 57 y.o.   MRN: [id number]   Vicki Ward is a 57 y.o. female presenting on 09/16/2017 for Follow-up (DM type 2)    Diabetes  She presents for her follow-up diabetic visit. She has type 2 diabetes mellitus. Onset time: She was diagnosed at approximate age of 60 years. Her disease course has been improving. There are no hypoglycemic associated symptoms. Pertinent negatives for hypoglycemia include no headaches or pallor. Pertinent negatives for diabetes include no chest pain, no fatigue, no foot ulcerations, no polyphagia and no weight loss. There are no hypoglycemic complications. Symptoms are improving. There are no diabetic complications. Risk factors for coronary artery disease include diabetes mellitus, dyslipidemia and hypertension. Current diabetic treatment includes insulin injections and oral agent (monotherapy). She is compliant with treatment most of the time. Her weight is fluctuating minimally. She is following a diabetic diet. Meal planning includes avoidance of concentrated sweets. She has had a previous visit with a dietitian. She participates in exercise intermittently. There is no change in her home blood glucose trend. Her breakfast blood glucose range is generally 130-140 mg/dl. Her lunch blood glucose range is generally 140-180 mg/dl. Her dinner blood glucose range is generally 140-180 mg/dl. Her overall blood glucose range is 140-180 mg/dl. An ACE inhibitor/angiotensin II receptor blocker is being taken. Eye exam is current.  Hypertension  This is a chronic problem. Pertinent negatives include no chest pain, headaches, palpitations or shortness of breath. Risk factors for coronary artery disease include dyslipidemia, diabetes mellitus and sedentary lifestyle. Past treatments include ACE inhibitors. The current treatment provides moderate improvement. There are no compliance problems.   Hyperlipidemia  This  is a chronic problem. The current episode started more than 1 year ago. The problem is controlled. Pertinent negatives include no chest pain or shortness of breath. Current antihyperlipidemic treatment includes statins. The current treatment provides moderate improvement of lipids. There are no compliance problems.  Risk factors for coronary artery disease include diabetes mellitus, dyslipidemia and hypertension.    Review of Systems  Constitutional: Negative for fatigue and weight loss.  HENT: Negative for dental problem, mouth sores and trouble swallowing.   Eyes: Negative for visual disturbance.  Respiratory: Negative for cough and shortness of breath.   Cardiovascular: Negative for chest pain, palpitations and leg swelling.  Endocrine: Negative for polyphagia.  Musculoskeletal: Negative for back pain and gait problem.  Skin: Negative for color change, pallor, rash and wound.  Neurological: Negative for headaches.      Objective:    BP 113/70   Pulse (!) 59   Ht 5\' 7"  (1.702 m)   Wt 160 lb (72.6 kg)   BMI 25.06 kg/m   Wt Readings from Last 3 Encounters:  09/16/17 160 lb (72.6 kg)  09/02/17 162 lb (73.5 kg)  05/15/17 164 lb (74.4 kg)    Physical Exam  Constitutional: She is oriented to person, place, and time. She appears well-developed and well-nourished.  HENT:  Head: Normocephalic and atraumatic.  Eyes: EOM are normal.  Neck: Normal range of motion. Neck supple.  Cardiovascular: Normal rate and regular rhythm.  Pulmonary/Chest: Effort normal and breath sounds normal. No respiratory distress. She has no wheezes.  Abdominal: Soft. Bowel sounds are normal. She exhibits no distension. There is no guarding.  Musculoskeletal: Normal range of motion.  Neurological: She is alert and oriented to person, place, and time. She has normal reflexes.  Skin:  Skin is warm and dry. No rash noted. No erythema. No pallor.  Psychiatric: She has a normal mood and affect. Her behavior is  normal. Judgment and thought content normal.     Results for orders placed or performed in visit on 08/26/17  Comprehensive metabolic panel  Result Value Ref Range   Glucose 197 (H) 65 - 99 mg/dL   BUN 15 6 - 24 mg/dL   Creatinine, Ser 0.62 0.57 - 1.00 mg/dL   GFR calc non Af Amer 102 >59 mL/min/1.73   GFR calc Af Amer 117 >59 mL/min/1.73   BUN/Creatinine Ratio 24 (H) 9 - 23   Sodium 143 134 - 144 mmol/L   Potassium 4.4 3.5 - 5.2 mmol/L   Chloride 104 96 - 106 mmol/L   CO2 24 20 - 29 mmol/L   Calcium 9.6 8.7 - 10.2 mg/dL   Total Protein 7.0 6.0 - 8.5 g/dL   Albumin 4.4 3.5 - 5.5 g/dL   Globulin, Total 2.6 1.5 - 4.5 g/dL   Albumin/Globulin Ratio 1.7 1.2 - 2.2   Bilirubin Total 0.3 0.0 - 1.2 mg/dL   Alkaline Phosphatase 85 39 - 117 IU/L   AST 27 0 - 40 IU/L   ALT 21 0 - 32 IU/L  Hgb A1c w/o eAG  Result Value Ref Range   Hgb A1c MFr Bld 8.5 (H) 4.8 - 5.6 %  Specimen status report  Result Value Ref Range   specimen status report Comment    Complete Blood Count (Most recent): Lab Results  Component Value Date   WBC 11.0 (H) 05/12/2008   HGB 13.1 05/12/2008   HCT 38.8 05/12/2008   MCV 94.3 05/12/2008   PLT 287 05/12/2008   Chemistry (most recent): Lab Results  Component Value Date   NA 143 08/26/2017   K 4.4 08/26/2017   CL 104 08/26/2017   CO2 24 08/26/2017   BUN 15 08/26/2017   CREATININE 0.62 08/26/2017   Diagnosis:  1. uncontrolled type 2 diabetes 2.  Hyperlipidemia 3. Hypertension    Assessment & Plan:   1. Uncontrolled type 2 DM: -She came with improved blood glucose profile.  No further fasting hypoglycemia, still fluctuating postprandial blood glucose readings.  She has difficulty maintaining the current monitoring requirement to use basal/bolus insulin.  -  Her A1c is 8.5% unchanged from her last visit.   I have counseled the patient  to stay on diet management by adopting a carbohydrate restricted diet. Her body weight is appropriate at this time.   -Due to her difficult work schedule, she will most likely benefit from a simplified treatment regimen such as NovoLog 70/30 to use twice a day instead of basal/bolus insulin regimen.   -However, she has adequate supplies of Basaglar and Fiasp she wishes to use up before the switch.     - I advised her to continue  Basaglar  30 units nightly,  Fiasp 5-11 3 times daily for meals for pre-meal blood glucose readings above 90 mg/dL.   -She is asked to monitor blood glucose 4 times a day-before meals and at bedtime. -She will benefit from continuous glucose monitoring.  Discussed and prescribed the [facility] device for her. - I will continue Metformin 1000 mg by mouth twice a day.  -Target numbers for a1c, LDL, HDL, Triglycerides, waist circumference were discussed in detail.   2. HTN: Her blood pressure is controlled to target.  I advised her to continue her current medications including enalapril, and salt restriction.  3. Hyperlipidemia: I advised her to continue Zocor. Uncontrolled with LDL 76 and HDL 83. I will add fasting lipid panel with her next lab test.  4. Chronic care management: -Patient is on ACEI and Statin medications and encouraged to continue to follow up with Ophthalmology at least yearly or according to recommendations, and advised to stay away from smoking.  -Patient is advised to continue f/u with her PMD for primary care needs.  - Time spent with the patient: 25 min, of which >50% was spent in reviewing her blood glucose logs , discussing her hypo- and hyper-glycemic episodes, reviewing her current and  previous labs and insulin doses and developing a plan to avoid hypo- and hyper-glycemia. Please refer to Patient Instructions for Blood Glucose Monitoring and Insulin/Medications Dosing Guide"  in media tab for additional information. [name] participated in the discussions, expressed understanding, and voiced agreement with the above plans.  All questions were  answered to her satisfaction. she is encouraged to contact clinic should she have any questions or concerns prior to her return visit.  Follow up plan: Return in about 3 months (around 12/16/2017) for meter, and logs.   Glade Lloyd, MD [facility]  This note was partially dictated with voice recognition software. Similar sounding words can be transcribed inadequately or may not  be corrected upon review.

## 2017-09-16 NOTE — Patient Instructions (Signed)

## 2017-10-27 DIAGNOSIS — M20011 Mallet finger of right finger(s): Secondary | ICD-10-CM | POA: Diagnosis not present

## 2017-10-27 DIAGNOSIS — Z6824 Body mass index (BMI) 24.0-24.9, adult: Secondary | ICD-10-CM | POA: Diagnosis not present

## 2017-11-15 ENCOUNTER — Other Ambulatory Visit: Payer: Self-pay | Admitting: "Endocrinology

## 2017-12-17 ENCOUNTER — Ambulatory Visit: Payer: 59 | Admitting: "Endocrinology

## 2017-12-18 ENCOUNTER — Other Ambulatory Visit: Payer: Self-pay | Admitting: "Endocrinology

## 2017-12-18 DIAGNOSIS — E118 Type 2 diabetes mellitus with unspecified complications: Secondary | ICD-10-CM | POA: Diagnosis not present

## 2017-12-18 DIAGNOSIS — Z794 Long term (current) use of insulin: Secondary | ICD-10-CM | POA: Diagnosis not present

## 2017-12-18 DIAGNOSIS — E1165 Type 2 diabetes mellitus with hyperglycemia: Secondary | ICD-10-CM | POA: Diagnosis not present

## 2017-12-19 LAB — COMPREHENSIVE METABOLIC PANEL
ALBUMIN: 4.3 g/dL (ref 3.5–5.5)
ALK PHOS: 79 IU/L (ref 39–117)
ALT: 19 IU/L (ref 0–32)
AST: 14 IU/L (ref 0–40)
Albumin/Globulin Ratio: 1.8 (ref 1.2–2.2)
BUN/Creatinine Ratio: 26 — ABNORMAL HIGH (ref 9–23)
BUN: 16 mg/dL (ref 6–24)
Bilirubin Total: 0.4 mg/dL (ref 0.0–1.2)
CHLORIDE: 104 mmol/L (ref 96–106)
CO2: 26 mmol/L (ref 20–29)
Calcium: 9.5 mg/dL (ref 8.7–10.2)
Creatinine, Ser: 0.62 mg/dL (ref 0.57–1.00)
GFR calc Af Amer: 117 mL/min/{1.73_m2} (ref >59)
GFR calc non Af Amer: 102 mL/min/{1.73_m2} (ref >59)
Globulin, Total: 2.4 g/dL (ref 1.5–4.5)
Glucose: 165 mg/dL — ABNORMAL HIGH (ref 65–99)
Potassium: 4.9 mmol/L (ref 3.5–5.2)
Sodium: 142 mmol/L (ref 134–144)
Total Protein: 6.7 g/dL (ref 6.0–8.5)

## 2017-12-19 LAB — SPECIMEN STATUS REPORT

## 2017-12-19 LAB — HGB A1C W/O EAG: HEMOGLOBIN A1C: 8.4 % — AB (ref 4.8–5.6)

## 2017-12-25 ENCOUNTER — Encounter: Payer: Self-pay | Admitting: "Endocrinology

## 2017-12-25 ENCOUNTER — Ambulatory Visit (INDEPENDENT_AMBULATORY_CARE_PROVIDER_SITE_OTHER): Payer: 59

## 2017-12-25 ENCOUNTER — Encounter: Payer: Self-pay | Admitting: Orthopaedic Surgery

## 2017-12-25 ENCOUNTER — Ambulatory Visit (INDEPENDENT_AMBULATORY_CARE_PROVIDER_SITE_OTHER): Payer: 59 | Admitting: "Endocrinology

## 2017-12-25 ENCOUNTER — Ambulatory Visit: Payer: 59 | Admitting: Orthopaedic Surgery

## 2017-12-25 VITALS — BP 126/79 | HR 70 | Ht 67.0 in | Wt 161.0 lb

## 2017-12-25 VITALS — BP 150/87 | HR 67 | Temp 97.8°F | Ht 67.5 in | Wt 162.0 lb

## 2017-12-25 DIAGNOSIS — E118 Type 2 diabetes mellitus with unspecified complications: Secondary | ICD-10-CM | POA: Diagnosis not present

## 2017-12-25 DIAGNOSIS — M20011 Mallet finger of right finger(s): Secondary | ICD-10-CM

## 2017-12-25 DIAGNOSIS — E1165 Type 2 diabetes mellitus with hyperglycemia: Secondary | ICD-10-CM | POA: Diagnosis not present

## 2017-12-25 DIAGNOSIS — IMO0002 Reserved for concepts with insufficient information to code with codable children: Secondary | ICD-10-CM

## 2017-12-25 DIAGNOSIS — I10 Essential (primary) hypertension: Secondary | ICD-10-CM

## 2017-12-25 DIAGNOSIS — Z794 Long term (current) use of insulin: Secondary | ICD-10-CM

## 2017-12-25 DIAGNOSIS — E782 Mixed hyperlipidemia: Secondary | ICD-10-CM | POA: Diagnosis not present

## 2017-12-25 MED ORDER — BASAGLAR KWIKPEN 100 UNIT/ML ~~LOC~~ SOPN: 35.0000 [IU] | PEN_INJECTOR | Freq: Every day | SUBCUTANEOUS | 2 refills | Status: DC

## 2017-12-25 MED ORDER — INSULIN ASPART 100 UNIT/ML FLEXPEN: 5.0000 [IU] | PEN_INJECTOR | Freq: Three times a day (TID) | SUBCUTANEOUS | 2 refills | Status: DC

## 2017-12-25 MED ORDER — FREESTYLE LIBRE 14 DAY SENSOR MISC: 1.0000 | 2 refills | Status: DC

## 2017-12-25 NOTE — Progress Notes (Signed)
Subjective:    Patient ID: Vicki Ward, female    DOB: June 11, 1960, 57 y.o.   MRN: [id number]  HPI She was rolling a foam mattress about two months ago and felt a pop and pain in the DIP joint of the right dominant little finger.  She noted she could not extend the finger at the DIP joint.  She has "put up with it" over these last two months.  She was seen at [facility] and they asked she come here.  She has no redness, no numbness just deformity and inability to extend the little finger.   Review of Systems  Constitutional: Positive for activity change.  Musculoskeletal: Positive for arthralgias.  All other systems reviewed and are negative.  For Review of Systems, all other systems reviewed and are negative.  Past Medical History:  Diagnosis Date  . Diabetes ([place])   . Hypercholesteremia     Past Surgical History:  Procedure Laterality Date  . APPENDECTOMY    . COLONOSCOPY N/A 10/01/2012   Procedure: COLONOSCOPY;  Surgeon: Daneil Dolin, MD;  Location: [facility];  Service: Endoscopy;  Laterality: N/A;  11:00  . DILATION AND CURETTAGE OF UTERUS      Current Outpatient Medications on File Prior to Visit  Medication Sig Dispense Refill  . aspirin 81 MG tablet Take 81 mg by mouth daily.    . BD PEN NEEDLE NANO U/F 32G X 4 MM MISC USE 1 NEEDLE WITH INSULIN PEN 4 TIMES A DAY 150 each 2  . Continuous Blood Gluc Sensor (FREESTYLE LIBRE 14 DAY SENSOR) MISC Inject 1 each into the skin every 14 (fourteen) days. Use as directed. 2 each 2  . enalapril (VASOTEC) 5 MG tablet Take 5 mg by mouth daily.    . Insulin Glargine (BASAGLAR KWIKPEN) 100 UNIT/ML SOPN Inject 0.3 mLs (30 Units total) into the skin at bedtime. 5 pen 2  . metFORMIN (GLUCOPHAGE) 1000 MG tablet 2 (two) times daily.    . Multiple Vitamin (MULTIVITAMIN) capsule Take 1 capsule by mouth daily.    Glory Rosebush VERIO test strip USE AS DIRECTED 4 x DAILY . E11.65 150 each 5  . simvastatin (ZOCOR) 20 MG tablet Take  20 mg by mouth every evening.    . insulin aspart (FIASP FLEXTOUCH) 100 UNIT/ML FlexPen Inject 5-11 Units into the skin 3 (three) times daily with meals. (Patient not taking: Reported on 12/25/2017) 5 pen 2   No current facility-administered medications on file prior to visit.     Social History   Socioeconomic History  . Marital status: Divorced    Spouse name: Not on file  . Number of children: 1  . Years of education: Not on file  . Highest education level: Not on file  Occupational History  . Occupation: [employer] Department    Employer: [employer]    Comment: detention    Employer: [employer]  . Financial resource strain: Not on file  . Food insecurity:    Worry: Not on file    Inability: Not on file  . Transportation needs:    Medical: Not on file    Non-medical: Not on file  Tobacco Use  . Smoking status: Never Smoker  . Smokeless tobacco: Never Used  Substance and Sexual Activity  . Alcohol use: Yes    Comment: socially  . Drug use: No  . Sexual activity: Not on file  Lifestyle  . Physical activity:  Days per week: Not on file    Minutes per session: Not on file  . Stress: Not on file  Relationships  . Social connections:    Talks on phone: Not on file    Gets together: Not on file    Attends religious service: Not on file    Active member of club or organization: Not on file    Attends meetings of clubs or organizations: Not on file    Relationship status: Not on file  . Intimate partner violence:    Fear of current or ex partner: Not on file    Emotionally abused: Not on file    Physically abused: Not on file    Forced sexual activity: Not on file  Other Topics Concern  . Not on file  Social History Narrative  . Not on file    Family History  Problem Relation Age of Onset  . Rectal cancer Sister        age at onset 75, recently diagnosed   . Diabetes type I Mother   . Diabetes Mother   .  Hypertension Father   . Diabetes Sister   . Diabetes Brother     BP (!) 150/87   Pulse 67   Temp 97.8 F (36.6 C)   Ht 5' 7.5" (1.715 m)   Wt 162 lb (73.5 kg)   BMI 25.00 kg/m   Body mass index is 25 kg/m.      Objective:   Physical Exam  Constitutional: She is oriented to person, place, and time. She appears well-developed and well-nourished.  HENT:  Head: Normocephalic and atraumatic.  Eyes: Pupils are equal, round, and reactive to light. Conjunctivae and EOM are normal.  Neck: Normal range of motion. Neck supple.  Cardiovascular: Normal rate, regular rhythm and intact distal pulses.  Pulmonary/Chest: Effort normal.  Abdominal: Soft.  Musculoskeletal:       Hands: Neurological: She is alert and oriented to person, place, and time. She has normal reflexes. She displays normal reflexes. No cranial nerve deficit. She exhibits normal muscle tone. Coordination normal.  Skin: Skin is warm and dry.  Psychiatric: She has a normal mood and affect. Her behavior is normal. Judgment and thought content normal.     X-rays were done of the right little finger, reported separately.     Assessment & Plan:   Encounter Diagnoses  Name Primary?  . Mallet finger of right finger(s) Yes  . Mallet finger of right hand    She has had the deformity for eight weeks plus.  She has no bony fragment.  I have told her the chance of this improving with splinting is not very good.  I did apply a finger splint.  I will have her see hand surgeon in [place] for further evaluation.  Patient is agreeable to this.  Call if any problem.  Precautions discussed.   Electronically Signed Sanjuana Kava, MD 7/18/20198:56 AM

## 2017-12-25 NOTE — Progress Notes (Signed)
Endocrinology follow-up note   Subjective:    Patient ID: Vicki Ward, female    DOB: 07/30/1960, 57 y.o.   MRN: [id number]   Vicki Ward is a 57 y.o. female presenting on 12/25/2017 for Follow-up (DM type 2)    Diabetes  She presents for her follow-up diabetic visit. She has type 2 diabetes mellitus. Onset time: She was diagnosed at approximate age of 4 years. Her disease course has been worsening. There are no hypoglycemic associated symptoms. Pertinent negatives for hypoglycemia include no headaches or pallor. Pertinent negatives for diabetes include no chest pain, no fatigue, no foot ulcerations, no polyphagia and no weight loss. There are no hypoglycemic complications. Symptoms are worsening. There are no diabetic complications. Risk factors for coronary artery disease include diabetes mellitus, dyslipidemia and hypertension. Current diabetic treatment includes insulin injections and oral agent (monotherapy). She is compliant with treatment most of the time. Her weight is fluctuating minimally. She is following a diabetic diet. Meal planning includes avoidance of concentrated sweets. She has had a previous visit with a dietitian. She participates in exercise intermittently. Her home blood glucose trend is fluctuating minimally. Her breakfast blood glucose range is generally 140-180 mg/dl. Her lunch blood glucose range is generally 140-180 mg/dl. Her dinner blood glucose range is generally 140-180 mg/dl. Her overall blood glucose range is 140-180 mg/dl. An ACE inhibitor/angiotensin II receptor blocker is being taken. Eye exam is current.  Hypertension  This is a chronic problem. Pertinent negatives include no chest pain, headaches, palpitations or shortness of breath. Risk factors for coronary artery disease include dyslipidemia, diabetes mellitus and sedentary lifestyle. Past treatments include ACE inhibitors. The current treatment provides moderate improvement. There are no compliance  problems.   Hyperlipidemia  This is a chronic problem. The current episode started more than 1 year ago. The problem is controlled. Pertinent negatives include no chest pain or shortness of breath. Current antihyperlipidemic treatment includes statins. The current treatment provides moderate improvement of lipids. There are no compliance problems.  Risk factors for coronary artery disease include diabetes mellitus, dyslipidemia and hypertension.    Review of Systems  Constitutional: Negative for fatigue and weight loss.  HENT: Negative for dental problem, mouth sores and trouble swallowing.   Eyes: Negative for visual disturbance.  Respiratory: Negative for cough and shortness of breath.   Cardiovascular: Negative for chest pain, palpitations and leg swelling.  Endocrine: Negative for polyphagia.  Musculoskeletal: Negative for back pain and gait problem.  Skin: Negative for color change, pallor, rash and wound.  Neurological: Negative for headaches.      Objective:    BP 126/79   Pulse 70   Ht 5\' 7"  (1.702 m)   Wt 161 lb (73 kg)   BMI 25.22 kg/m   Wt Readings from Last 3 Encounters:  12/25/17 161 lb (73 kg)  12/25/17 162 lb (73.5 kg)  09/16/17 160 lb (72.6 kg)    Physical Exam  Constitutional: She is oriented to person, place, and time. She appears well-developed and well-nourished.  HENT:  Head: Normocephalic and atraumatic.  Eyes: EOM are normal.  Neck: Normal range of motion. Neck supple.  Cardiovascular: Normal rate and regular rhythm.  Pulmonary/Chest: Effort normal and breath sounds normal. No respiratory distress. She has no wheezes.  Abdominal: Soft. Bowel sounds are normal. She exhibits no distension. There is no guarding.  Musculoskeletal: Normal range of motion.  Neurological: She is alert and oriented to person, place, and time. She has normal reflexes.  Skin:  Skin is warm and dry. No rash noted. No erythema. No pallor.  Psychiatric: She has a normal mood and  affect. Her behavior is normal. Judgment and thought content normal.     Results for orders placed or performed in visit on 12/18/17  Comprehensive metabolic panel  Result Value Ref Range   Glucose 165 (H) 65 - 99 mg/dL   BUN 16 6 - 24 mg/dL   Creatinine, Ser 0.62 0.57 - 1.00 mg/dL   GFR calc non Af Amer 102 >59 mL/min/1.73   GFR calc Af Amer 117 >59 mL/min/1.73   BUN/Creatinine Ratio 26 (H) 9 - 23   Sodium 142 134 - 144 mmol/L   Potassium 4.9 3.5 - 5.2 mmol/L   Chloride 104 96 - 106 mmol/L   CO2 26 20 - 29 mmol/L   Calcium 9.5 8.7 - 10.2 mg/dL   Total Protein 6.7 6.0 - 8.5 g/dL   Albumin 4.3 3.5 - 5.5 g/dL   Globulin, Total 2.4 1.5 - 4.5 g/dL   Albumin/Globulin Ratio 1.8 1.2 - 2.2   Bilirubin Total 0.4 0.0 - 1.2 mg/dL   Alkaline Phosphatase 79 39 - 117 IU/L   AST 14 0 - 40 IU/L   ALT 19 0 - 32 IU/L  Hgb A1c w/o eAG  Result Value Ref Range   Hgb A1c MFr Bld 8.4 (H) 4.8 - 5.6 %  Specimen status report  Result Value Ref Range   specimen status report Comment    Complete Blood Count (Most recent): Lab Results  Component Value Date   WBC 11.0 (H) 05/12/2008   HGB 13.1 05/12/2008   HCT 38.8 05/12/2008   MCV 94.3 05/12/2008   PLT 287 05/12/2008    Lipid Panel     Component Value Date/Time   CHOL 169 05/08/2017 1031   TRIG 54 05/08/2017 1031   HDL 79 05/08/2017 1031   CHOLHDL 2.0 04/02/2016 1037   LDLCALC 79 05/08/2017 1031    Diagnosis:  1. uncontrolled type 2 diabetes 2.  Hyperlipidemia 3. Hypertension    Assessment & Plan:   1. Uncontrolled type 2 DM: -She came with glycemic profile unchanged from last visit, largely due to her withdrawal of Fiasp for unclear reasons.  -Analysis of her CGM report shows her average blood glucose is between 155 and 175.  -Her previsit labs show A1c of 8.4%.  I have counseled the patient  to stay on diet management by adopting a carbohydrate restricted diet. Her body weight is appropriate at this time.   - I advised her to  increase his Basaglar to 35  units nightly, resume Fiasp 5-11 units 3 times daily for meals for pre-meal blood glucose readings above 90 mg/dL.   -She is asked to document  blood glucose 4 times a day-before meals and at bedtime. -She will benefit from continuous glucose monitoring.  She is advised to continue to wear her Freestyle Libre device at all times.  - I will continue Metformin 1000 mg by mouth twice a day.  -Target numbers for a1c, LDL, HDL, Triglycerides, waist circumference were discussed in detail.   2. HTN: Her blood pressure is controlled to target.  She is advised to continue her current medications including enalapril 5 mg p.o. daily and salt restriction.  3. Hyperlipidemia: Her recent lipid panel showed controlled LDL at 76.  She is advised to continue her simvastatin 20 mg p.o. nightly.  She will be considered for fasting lipid panel along with her next previsit labs.  4. Chronic care management: -Patient is on ACEI and Statin medications and encouraged to continue to follow up with Ophthalmology at least yearly or according to recommendations, and advised to stay away from smoking.  -Patient is advised to continue f/u with her PMD for primary care needs.  - Time spent with the patient: 25 min, of which >50% was spent in reviewing her blood glucose logs , discussing her hypo- and hyper-glycemic episodes, reviewing her current and  previous labs and insulin doses and developing a plan to avoid hypo- and hyper-glycemia. Please refer to Patient Instructions for Blood Glucose Monitoring and Insulin/Medications Dosing Guide"  in media tab for additional information. [name] participated in the discussions, expressed understanding, and voiced agreement with the above plans.  All questions were answered to her satisfaction. she is encouraged to contact clinic should she have any questions or concerns prior to her return visit.  Follow up plan: Return in about 3 months (around  03/27/2018) for meter, and logs.   Glade Lloyd, MD [facility]  This note was partially dictated with voice recognition software. Similar sounding words can be transcribed inadequately or may not  be corrected upon review.

## 2017-12-25 NOTE — Patient Instructions (Signed)

## 2017-12-30 ENCOUNTER — Other Ambulatory Visit: Payer: Self-pay

## 2017-12-30 MED ORDER — ONETOUCH VERIO VI STRP: ORAL_STRIP | 5 refills | Status: DC

## 2017-12-30 MED ORDER — BASAGLAR KWIKPEN 100 UNIT/ML ~~LOC~~ SOPN: 35.0000 [IU] | PEN_INJECTOR | Freq: Every day | SUBCUTANEOUS | 2 refills | Status: DC

## 2018-01-02 ENCOUNTER — Other Ambulatory Visit: Payer: Self-pay | Admitting: "Endocrinology

## 2018-01-14 DIAGNOSIS — M20011 Mallet finger of right finger(s): Secondary | ICD-10-CM | POA: Diagnosis not present

## 2018-01-14 DIAGNOSIS — M79644 Pain in right finger(s): Secondary | ICD-10-CM | POA: Diagnosis not present

## 2018-01-14 DIAGNOSIS — M25641 Stiffness of right hand, not elsewhere classified: Secondary | ICD-10-CM | POA: Diagnosis not present

## 2018-01-23 DIAGNOSIS — Z1231 Encounter for screening mammogram for malignant neoplasm of breast: Secondary | ICD-10-CM | POA: Diagnosis not present

## 2018-02-25 DIAGNOSIS — M20011 Mallet finger of right finger(s): Secondary | ICD-10-CM | POA: Diagnosis not present

## 2018-02-25 DIAGNOSIS — M25641 Stiffness of right hand, not elsewhere classified: Secondary | ICD-10-CM | POA: Diagnosis not present

## 2018-02-25 DIAGNOSIS — M79644 Pain in right finger(s): Secondary | ICD-10-CM | POA: Diagnosis not present

## 2018-03-25 DIAGNOSIS — M25641 Stiffness of right hand, not elsewhere classified: Secondary | ICD-10-CM | POA: Diagnosis not present

## 2018-03-25 DIAGNOSIS — M20011 Mallet finger of right finger(s): Secondary | ICD-10-CM | POA: Diagnosis not present

## 2018-03-26 ENCOUNTER — Other Ambulatory Visit: Payer: Self-pay | Admitting: "Endocrinology

## 2018-03-26 DIAGNOSIS — E1165 Type 2 diabetes mellitus with hyperglycemia: Secondary | ICD-10-CM | POA: Diagnosis not present

## 2018-03-26 DIAGNOSIS — Z794 Long term (current) use of insulin: Secondary | ICD-10-CM | POA: Diagnosis not present

## 2018-03-26 DIAGNOSIS — E782 Mixed hyperlipidemia: Secondary | ICD-10-CM | POA: Diagnosis not present

## 2018-03-26 DIAGNOSIS — E118 Type 2 diabetes mellitus with unspecified complications: Secondary | ICD-10-CM | POA: Diagnosis not present

## 2018-03-27 LAB — T4, FREE: Free T4: 1.2 ng/dL (ref 0.82–1.77)

## 2018-03-27 LAB — MICROALBUMIN, URINE: Microalbumin, Urine: 4.8 ug/mL

## 2018-03-27 LAB — COMPREHENSIVE METABOLIC PANEL
ALK PHOS: 82 IU/L (ref 39–117)
ALT: 16 IU/L (ref 0–32)
AST: 15 IU/L (ref 0–40)
Albumin/Globulin Ratio: 1.9 (ref 1.2–2.2)
Albumin: 4.3 g/dL (ref 3.5–5.5)
BILIRUBIN TOTAL: 0.2 mg/dL (ref 0.0–1.2)
BUN/Creatinine Ratio: 22 (ref 9–23)
BUN: 13 mg/dL (ref 6–24)
CO2: 26 mmol/L (ref 20–29)
Calcium: 9.5 mg/dL (ref 8.7–10.2)
Chloride: 104 mmol/L (ref 96–106)
Creatinine, Ser: 0.59 mg/dL (ref 0.57–1.00)
GFR calc Af Amer: 119 mL/min/{1.73_m2} (ref >59)
GFR calc non Af Amer: 104 mL/min/{1.73_m2} (ref >59)
GLUCOSE: 178 mg/dL — AB (ref 65–99)
Globulin, Total: 2.3 g/dL (ref 1.5–4.5)
Potassium: 4.7 mmol/L (ref 3.5–5.2)
Sodium: 143 mmol/L (ref 134–144)
Total Protein: 6.6 g/dL (ref 6.0–8.5)

## 2018-03-27 LAB — VITAMIN D 25 HYDROXY (VIT D DEFICIENCY, FRACTURES): Vit D, 25-Hydroxy: 30.9 ng/mL (ref 30.0–100.0)

## 2018-03-27 LAB — LIPID PANEL W/O CHOL/HDL RATIO
CHOLESTEROL TOTAL: 179 mg/dL (ref 100–199)
HDL: 71 mg/dL (ref >39)
LDL Calculated: 95 mg/dL (ref 0–99)
TRIGLYCERIDES: 63 mg/dL (ref 0–149)
VLDL CHOLESTEROL CAL: 13 mg/dL (ref 5–40)

## 2018-03-27 LAB — HGB A1C W/O EAG: Hgb A1c MFr Bld: 8.2 % — ABNORMAL HIGH (ref 4.8–5.6)

## 2018-03-27 LAB — SPECIMEN STATUS REPORT

## 2018-03-27 LAB — TSH: TSH: 1.92 u[IU]/mL (ref 0.450–4.500)

## 2018-04-03 ENCOUNTER — Encounter: Payer: Self-pay | Admitting: "Endocrinology

## 2018-04-03 ENCOUNTER — Ambulatory Visit (INDEPENDENT_AMBULATORY_CARE_PROVIDER_SITE_OTHER): Payer: 59 | Admitting: "Endocrinology

## 2018-04-03 ENCOUNTER — Other Ambulatory Visit: Payer: Self-pay | Admitting: "Endocrinology

## 2018-04-03 VITALS — HR 74 | Ht 67.0 in | Wt 158.0 lb

## 2018-04-03 DIAGNOSIS — E782 Mixed hyperlipidemia: Secondary | ICD-10-CM

## 2018-04-03 DIAGNOSIS — Z794 Long term (current) use of insulin: Secondary | ICD-10-CM

## 2018-04-03 DIAGNOSIS — E118 Type 2 diabetes mellitus with unspecified complications: Secondary | ICD-10-CM | POA: Diagnosis not present

## 2018-04-03 DIAGNOSIS — E1165 Type 2 diabetes mellitus with hyperglycemia: Secondary | ICD-10-CM

## 2018-04-03 DIAGNOSIS — I10 Essential (primary) hypertension: Secondary | ICD-10-CM | POA: Diagnosis not present

## 2018-04-03 DIAGNOSIS — IMO0002 Reserved for concepts with insufficient information to code with codable children: Secondary | ICD-10-CM

## 2018-04-03 MED ORDER — INSULIN ASPART 100 UNIT/ML FLEXPEN: 6.0000 [IU] | PEN_INJECTOR | Freq: Three times a day (TID) | SUBCUTANEOUS | 2 refills | Status: DC

## 2018-04-03 MED ORDER — BASAGLAR KWIKPEN 100 UNIT/ML ~~LOC~~ SOPN: 40.0000 [IU] | PEN_INJECTOR | Freq: Every day | SUBCUTANEOUS | 2 refills | Status: DC

## 2018-04-03 NOTE — Progress Notes (Signed)
Endocrinology follow-up note   Subjective:    Patient ID: Vicki Ward, female    DOB: 04-10-1961, 57 y.o.   MRN: [id number]   Vicki Ward is a 57 y.o. female presenting on 04/03/2018 for Follow-up (DM type 2)  Diabetes  She presents for her follow-up diabetic visit. She has type 2 diabetes mellitus. Onset time: She was diagnosed at approximate age of 35 years. Her disease course has been stable. There are no hypoglycemic associated symptoms. Pertinent negatives for hypoglycemia include no headaches or pallor. Pertinent negatives for diabetes include no chest pain, no fatigue, no foot ulcerations, no polyphagia and no weight loss. There are no hypoglycemic complications. Symptoms are worsening. There are no diabetic complications. Risk factors for coronary artery disease include diabetes mellitus, dyslipidemia and hypertension. Current diabetic treatment includes insulin injections and oral agent (monotherapy). She is compliant with treatment most of the time. Her weight is fluctuating minimally. She is following a diabetic diet. Meal planning includes avoidance of concentrated sweets. She has had a previous visit with a dietitian. She participates in exercise intermittently. Her home blood glucose trend is fluctuating minimally. Her breakfast blood glucose range is generally 140-180 mg/dl. Her lunch blood glucose range is generally 140-180 mg/dl. Her dinner blood glucose range is generally 140-180 mg/dl. Her overall blood glucose range is 140-180 mg/dl. An ACE inhibitor/angiotensin II receptor blocker is being taken. Eye exam is current.  Hypertension  This is a chronic problem. Pertinent negatives include no chest pain, headaches, palpitations or shortness of breath. Risk factors for coronary artery disease include dyslipidemia, diabetes mellitus and sedentary lifestyle. Past treatments include ACE inhibitors. The current treatment provides moderate improvement. There are no compliance  problems.   Hyperlipidemia  This is a chronic problem. The current episode started more than 1 year ago. The problem is controlled. Pertinent negatives include no chest pain or shortness of breath. Current antihyperlipidemic treatment includes statins. The current treatment provides moderate improvement of lipids. There are no compliance problems.  Risk factors for coronary artery disease include diabetes mellitus, dyslipidemia and hypertension.    Review of Systems  Constitutional: Negative for fatigue and weight loss.  HENT: Negative for dental problem, mouth sores and trouble swallowing.   Eyes: Negative for visual disturbance.  Respiratory: Negative for cough and shortness of breath.   Cardiovascular: Negative for chest pain, palpitations and leg swelling.  Endocrine: Negative for polyphagia.  Musculoskeletal: Negative for back pain and gait problem.  Skin: Negative for color change, pallor, rash and wound.  Neurological: Negative for headaches.      Objective:    Pulse 74   Ht 5\' 7"  (1.702 m)   Wt 158 lb (71.7 kg)   BMI 24.75 kg/m   Wt Readings from Last 3 Encounters:  04/03/18 158 lb (71.7 kg)  12/25/17 161 lb (73 kg)  12/25/17 162 lb (73.5 kg)    Physical Exam  Constitutional: She is oriented to person, place, and time. She appears well-developed and well-nourished.  HENT:  Head: Normocephalic and atraumatic.  Eyes: EOM are normal.  Neck: Normal range of motion. Neck supple.  Cardiovascular: Normal rate and regular rhythm.  Pulmonary/Chest: Effort normal and breath sounds normal. No respiratory distress. She has no wheezes.  Abdominal: Soft. Bowel sounds are normal. She exhibits no distension. There is no guarding.  Musculoskeletal: Normal range of motion.  Neurological: She is alert and oriented to person, place, and time. She has normal reflexes.  Skin: Skin is warm and dry. No  rash noted. No erythema. No pallor.  Psychiatric: She has a normal mood and affect. Her  behavior is normal. Judgment and thought content normal.    Results for orders placed or performed in visit on 03/26/18  Comprehensive metabolic panel  Result Value Ref Range   Glucose 178 (H) 65 - 99 mg/dL   BUN 13 6 - 24 mg/dL   Creatinine, Ser 0.59 0.57 - 1.00 mg/dL   GFR calc non Af Amer 104 >59 mL/min/1.73   GFR calc Af Amer 119 >59 mL/min/1.73   BUN/Creatinine Ratio 22 9 - 23   Sodium 143 134 - 144 mmol/L   Potassium 4.7 3.5 - 5.2 mmol/L   Chloride 104 96 - 106 mmol/L   CO2 26 20 - 29 mmol/L   Calcium 9.5 8.7 - 10.2 mg/dL   Total Protein 6.6 6.0 - 8.5 g/dL   Albumin 4.3 3.5 - 5.5 g/dL   Globulin, Total 2.3 1.5 - 4.5 g/dL   Albumin/Globulin Ratio 1.9 1.2 - 2.2   Bilirubin Total 0.2 0.0 - 1.2 mg/dL   Alkaline Phosphatase 82 39 - 117 IU/L   AST 15 0 - 40 IU/L   ALT 16 0 - 32 IU/L  Lipid Panel w/o Chol/HDL Ratio  Result Value Ref Range   Cholesterol, Total 179 100 - 199 mg/dL   Triglycerides 63 0 - 149 mg/dL   HDL 71 >39 mg/dL   VLDL Cholesterol Cal 13 5 - 40 mg/dL   LDL Calculated 95 0 - 99 mg/dL  Hgb A1c w/o eAG  Result Value Ref Range   Hgb A1c MFr Bld 8.2 (H) 4.8 - 5.6 %  T4, free  Result Value Ref Range   Free T4 1.20 0.82 - 1.77 ng/dL  TSH  Result Value Ref Range   TSH 1.920 0.450 - 4.500 uIU/mL  VITAMIN D 25 Hydroxy (Vit-D Deficiency, Fractures)  Result Value Ref Range   Vit D, 25-Hydroxy 30.9 30.0 - 100.0 ng/mL  Microalbumin, urine  Result Value Ref Range   Microalbumin, Urine 4.8 Not Estab. ug/mL  Specimen status report  Result Value Ref Range   specimen status report Comment    Complete Blood Count (Most recent): Lab Results  Component Value Date   WBC 11.0 (H) 05/12/2008   HGB 13.1 05/12/2008   HCT 38.8 05/12/2008   MCV 94.3 05/12/2008   PLT 287 05/12/2008    Lipid Panel     Component Value Date/Time   CHOL 179 03/26/2018 1106   TRIG 63 03/26/2018 1106   HDL 71 03/26/2018 1106   CHOLHDL 2.0 04/02/2016 1037   LDLCALC 95 03/26/2018 1106     Diagnosis:  1. uncontrolled type 2 diabetes 2.  Hyperlipidemia 3. Hypertension    Assessment & Plan:   1. Uncontrolled type 2 DM: -She came with glycemic profile unchanged from last visit, largely due to her withdrawal of Fiasp for unclear reasons.  -Analysis of her CGM report shows her average blood glucose is between 155 and 175.  -Her previsit labs show A1c of 8.2%.  I have counseled the patient  to stay on diet management by adopting a carbohydrate restricted diet. Her body weight is appropriate at this time.   -She is advised to increase Basaglar to 40  units nightly, increase NovoLog to 6-9  units 3 times daily for meals for pre-meal blood glucose readings above 90 mg/dL.   -She is asked to document  blood glucose 4 times a day-before meals and at bedtime. -She  he is wearing and benefiting from continuous glucose monitoring.  She is advised to continue to wear her Freestyle Libre device at all times.  - I will continue Metformin 1000 mg by mouth twice a day for now.  This patient will need more work-up with anti-GAD and anti-islet cell antibodies to classify her diabetes more properly.  -Target numbers for a1c, LDL, HDL, Triglycerides, waist circumference were discussed in detail.   2. HTN: Her blood pressure is controlled to target.  She is advised to continue her current medications including enalapril 5 mg p.o. daily and salt restriction.  3. Hyperlipidemia: Her recent lipid panel showed controlled LDL at 76.  She is advised to continue her simvastatin 20 mg p.o. nightly.  She will be considered for fasting lipid panel along with her next previsit labs.   4. Chronic care management: -Patient is on ACEI and Statin medications and encouraged to continue to follow up with Ophthalmology at least yearly or according to recommendations, and advised to stay away from smoking.  -Patient is advised to continue f/u with her PMD for primary care needs.  - Time spent with the  patient: 25 min, of which >50% was spent in reviewing her blood glucose logs , discussing her hypo- and hyper-glycemic episodes, reviewing her current and  previous labs and insulin doses and developing a plan to avoid hypo- and hyper-glycemia. Please refer to Patient Instructions for Blood Glucose Monitoring and Insulin/Medications Dosing Guide"  in media tab for additional information. [name] participated in the discussions, expressed understanding, and voiced agreement with the above plans.  All questions were answered to her satisfaction. she is encouraged to contact clinic should she have any questions or concerns prior to her return visit.  Follow up plan: Return in about 3 months (around 07/04/2018) for Follow up with Pre-visit Labs, Meter, and Logs.   Glade Lloyd, MD [facility]  This note was partially dictated with voice recognition software. Similar sounding words can be transcribed inadequately or may not  be corrected upon review.

## 2018-04-03 NOTE — Patient Instructions (Signed)

## 2018-04-06 ENCOUNTER — Other Ambulatory Visit: Payer: Self-pay

## 2018-04-06 ENCOUNTER — Other Ambulatory Visit: Payer: Self-pay | Admitting: "Endocrinology

## 2018-04-06 MED ORDER — INSULIN ASPART 100 UNIT/ML FLEXPEN: 6.0000 [IU] | PEN_INJECTOR | Freq: Three times a day (TID) | SUBCUTANEOUS | 2 refills | Status: DC

## 2018-05-19 ENCOUNTER — Other Ambulatory Visit: Payer: Self-pay | Admitting: "Endocrinology

## 2018-06-26 ENCOUNTER — Other Ambulatory Visit: Payer: Self-pay | Admitting: "Endocrinology

## 2018-06-26 DIAGNOSIS — Z794 Long term (current) use of insulin: Secondary | ICD-10-CM | POA: Diagnosis not present

## 2018-06-26 DIAGNOSIS — E1165 Type 2 diabetes mellitus with hyperglycemia: Secondary | ICD-10-CM | POA: Diagnosis not present

## 2018-06-26 DIAGNOSIS — E118 Type 2 diabetes mellitus with unspecified complications: Secondary | ICD-10-CM | POA: Diagnosis not present

## 2018-06-29 ENCOUNTER — Other Ambulatory Visit: Payer: Self-pay | Admitting: "Endocrinology

## 2018-07-03 LAB — GLUTAMIC ACID DECARBOXYLASE AUTO ABS: Glutamic Acid Decarb Ab: <5 U/mL (ref 0.0–5.0)

## 2018-07-03 LAB — COMPREHENSIVE METABOLIC PANEL
ALT: 18 IU/L (ref 0–32)
AST: 17 IU/L (ref 0–40)
Albumin/Globulin Ratio: 1.5 (ref 1.2–2.2)
Albumin: 4.3 g/dL (ref 3.5–5.5)
Alkaline Phosphatase: 86 IU/L (ref 39–117)
BUN/Creatinine Ratio: 24 — ABNORMAL HIGH (ref 9–23)
BUN: 15 mg/dL (ref 6–24)
Bilirubin Total: 0.5 mg/dL (ref 0.0–1.2)
CALCIUM: 9.7 mg/dL (ref 8.7–10.2)
CO2: 24 mmol/L (ref 20–29)
Chloride: 102 mmol/L (ref 96–106)
Creatinine, Ser: 0.63 mg/dL (ref 0.57–1.00)
GFR calc Af Amer: 116 mL/min/{1.73_m2} (ref >59)
GFR, EST NON AFRICAN AMERICAN: 101 mL/min/{1.73_m2} (ref >59)
Globulin, Total: 2.9 g/dL (ref 1.5–4.5)
Glucose: 157 mg/dL — ABNORMAL HIGH (ref 65–99)
Potassium: 4.3 mmol/L (ref 3.5–5.2)
Sodium: 142 mmol/L (ref 134–144)
Total Protein: 7.2 g/dL (ref 6.0–8.5)

## 2018-07-03 LAB — INSULIN ANTIBODIES, BLOOD: Insulin AutoAb: 107 uU/mL — ABNORMAL HIGH

## 2018-07-03 LAB — MICROALBUMIN, URINE: Microalbumin, Urine: 4.7 ug/mL

## 2018-07-03 LAB — HGB A1C W/O EAG: Hgb A1c MFr Bld: 8 % — ABNORMAL HIGH (ref 4.8–5.6)

## 2018-07-07 ENCOUNTER — Encounter: Payer: Self-pay | Admitting: "Endocrinology

## 2018-07-07 ENCOUNTER — Ambulatory Visit (INDEPENDENT_AMBULATORY_CARE_PROVIDER_SITE_OTHER): Payer: 59 | Admitting: "Endocrinology

## 2018-07-07 VITALS — BP 128/72 | HR 71 | Ht 67.0 in | Wt 167.0 lb

## 2018-07-07 DIAGNOSIS — E1165 Type 2 diabetes mellitus with hyperglycemia: Secondary | ICD-10-CM | POA: Diagnosis not present

## 2018-07-07 DIAGNOSIS — E782 Mixed hyperlipidemia: Secondary | ICD-10-CM

## 2018-07-07 DIAGNOSIS — IMO0002 Reserved for concepts with insufficient information to code with codable children: Secondary | ICD-10-CM

## 2018-07-07 DIAGNOSIS — E118 Type 2 diabetes mellitus with unspecified complications: Secondary | ICD-10-CM

## 2018-07-07 DIAGNOSIS — Z794 Long term (current) use of insulin: Secondary | ICD-10-CM

## 2018-07-07 DIAGNOSIS — I10 Essential (primary) hypertension: Secondary | ICD-10-CM

## 2018-07-07 NOTE — Patient Instructions (Signed)

## 2018-07-07 NOTE — Progress Notes (Signed)
Endocrinology follow-up note   Subjective:    Patient ID: Vicki Ward, female    DOB: 1960-12-22, 58 y.o.   MRN: [id number]   Vicki Ward is a 58 y.o. female presenting on 07/07/2018 for Follow-up (DM type 2)  Diabetes  She presents for her follow-up diabetic visit. She has type 2 diabetes mellitus. Onset time: She was diagnosed at approximate age of 5 years. Her disease course has been improving. There are no hypoglycemic associated symptoms. Pertinent negatives for hypoglycemia include no headaches or pallor. Pertinent negatives for diabetes include no chest pain, no fatigue, no foot ulcerations, no polyphagia and no weight loss. There are no hypoglycemic complications. Symptoms are improving. There are no diabetic complications. Risk factors for coronary artery disease include diabetes mellitus, dyslipidemia and hypertension. Current diabetic treatment includes insulin injections and oral agent (monotherapy). She is compliant with treatment most of the time. Her weight is increasing steadily. She is following a diabetic diet. She has had a previous visit with a dietitian. She participates in exercise intermittently. Her home blood glucose trend is fluctuating minimally. Her breakfast blood glucose range is generally 140-180 mg/dl. Her lunch blood glucose range is generally 140-180 mg/dl. Her dinner blood glucose range is generally 140-180 mg/dl. Her overall blood glucose range is 140-180 mg/dl. An ACE inhibitor/angiotensin II receptor blocker is being taken. Eye exam is current.  Hypertension  This is a chronic problem. Pertinent negatives include no chest pain, headaches, palpitations or shortness of breath. Risk factors for coronary artery disease include dyslipidemia, diabetes mellitus and sedentary lifestyle. Past treatments include ACE inhibitors. The current treatment provides moderate improvement. There are no compliance problems.   Hyperlipidemia  This is a chronic problem. The  current episode started more than 1 year ago. The problem is controlled. Pertinent negatives include no chest pain or shortness of breath. Current antihyperlipidemic treatment includes statins. The current treatment provides moderate improvement of lipids. There are no compliance problems.  Risk factors for coronary artery disease include diabetes mellitus, dyslipidemia and hypertension.    Review of Systems  Constitutional: Negative for fatigue and weight loss.  HENT: Negative for dental problem, mouth sores and trouble swallowing.   Eyes: Negative for visual disturbance.  Respiratory: Negative for cough and shortness of breath.   Cardiovascular: Negative for chest pain, palpitations and leg swelling.  Endocrine: Negative for polyphagia.  Musculoskeletal: Negative for back pain and gait problem.  Skin: Negative for color change, pallor, rash and wound.  Neurological: Negative for headaches.      Objective:    BP 128/72   Pulse 71   Ht 5\' 7"  (1.702 m)   Wt 167 lb (75.8 kg)   BMI 26.16 kg/m   Wt Readings from Last 3 Encounters:  07/07/18 167 lb (75.8 kg)  04/03/18 158 lb (71.7 kg)  12/25/17 161 lb (73 kg)    Physical Exam  Constitutional: She is oriented to person, place, and time. She appears well-developed and well-nourished.  HENT:  Head: Normocephalic and atraumatic.  Eyes: EOM are normal.  Neck: Normal range of motion. Neck supple.  Cardiovascular: Normal rate and regular rhythm.  Pulmonary/Chest: Effort normal and breath sounds normal. No respiratory distress. She has no wheezes.  Abdominal: Soft. Bowel sounds are normal. She exhibits no distension. There is no guarding.  Musculoskeletal: Normal range of motion.  Neurological: She is alert and oriented to person, place, and time. She has normal reflexes.  Skin: Skin is warm and dry. No rash noted. No  erythema. No pallor.  Psychiatric: She has a normal mood and affect. Her behavior is normal. Judgment and thought content  normal.    Results for orders placed or performed in visit on 06/26/18  Comprehensive metabolic panel  Result Value Ref Range   Glucose 157 (H) 65 - 99 mg/dL   BUN 15 6 - 24 mg/dL   Creatinine, Ser 0.63 0.57 - 1.00 mg/dL   GFR calc non Af Amer 101 >59 mL/min/1.73   GFR calc Af Amer 116 >59 mL/min/1.73   BUN/Creatinine Ratio 24 (H) 9 - 23   Sodium 142 134 - 144 mmol/L   Potassium 4.3 3.5 - 5.2 mmol/L   Chloride 102 96 - 106 mmol/L   CO2 24 20 - 29 mmol/L   Calcium 9.7 8.7 - 10.2 mg/dL   Total Protein 7.2 6.0 - 8.5 g/dL   Albumin 4.3 3.5 - 5.5 g/dL   Globulin, Total 2.9 1.5 - 4.5 g/dL   Albumin/Globulin Ratio 1.5 1.2 - 2.2   Bilirubin Total 0.5 0.0 - 1.2 mg/dL   Alkaline Phosphatase 86 39 - 117 IU/L   AST 17 0 - 40 IU/L   ALT 18 0 - 32 IU/L  Insulin antibodies, blood  Result Value Ref Range   Insulin AutoAb 107 (H) uU/mL  Hgb A1c w/o eAG  Result Value Ref Range   Hgb A1c MFr Bld 8.0 (H) 4.8 - 5.6 %  Glutamic acid decarboxylase auto abs  Result Value Ref Range   Glutamic Acid Decarb Ab <5.0 0.0 - 5.0 U/mL  Microalbumin, urine  Result Value Ref Range   Microalbumin, Urine 4.7 Not Estab. ug/mL   Complete Blood Count (Most recent): Lab Results  Component Value Date   WBC 11.0 (H) 05/12/2008   HGB 13.1 05/12/2008   HCT 38.8 05/12/2008   MCV 94.3 05/12/2008   PLT 287 05/12/2008    Lipid Panel     Component Value Date/Time   CHOL 179 03/26/2018 1106   TRIG 63 03/26/2018 1106   HDL 71 03/26/2018 1106   CHOLHDL 2.0 04/02/2016 1037   LDLCALC 95 03/26/2018 1106    Diagnosis:  1. uncontrolled type 2 diabetes 2.  Hyperlipidemia 3. Hypertension    Assessment & Plan:   1. Uncontrolled type 2 DM: -She came with fluctuating glycemic profile, significant for an predictable insulin sensitivity.     -Her previsit labs show A1c of 8%, slowly improving from 8.5%.  I have counseled the patient  to stay on diet management by adopting a carbohydrate restricted diet. Her  body weight is appropriate at this time.   -She worries about hypoglycemia and would like to avoid escalating insulin treatment at this time.   -She is willing to remain on Basaglar 40 units nightly,  Humalog 6-9  units 3 times daily for meals for pre-meal blood glucose readings above 90 mg/dL.   -She is asked to document  blood glucose 4 times a day-before meals and at bedtime.  -She is advised to continue Metformin 1000 mg by mouth twice a day for now.  Her anti-GAD antibodies < 5 .   -Target numbers for a1c, LDL, HDL, Triglycerides, waist circumference were discussed in detail.   2. HTN: Her blood pressure is controlled to target.  She is advised to continue her current medications including enalapril 5 mg p.o. daily and salt restriction.  3. Hyperlipidemia: Her recent lipid panel showed uncontrolled LDL at 95.   She is advised to continue simvastatin 20 mg p.o.  nightly.  She will be considered for fasting lipid panel along with her next previsit labs.   4. Chronic care management: -Patient is on ACEI and Statin medications and encouraged to continue to follow up with Ophthalmology at least yearly or according to recommendations, and advised to stay away from smoking.  -Patient is advised to continue f/u with her PMD for primary care needs.  - Time spent with the patient: 25 min, of which >50% was spent in reviewing her blood glucose logs , discussing her hypoglycemia and hyperglycemia episodes, reviewing her current and  previous labs / studies and medications  doses and developing a plan to avoid hypoglycemia and hyperglycemia. Please refer to Patient Instructions for Blood Glucose Monitoring and Insulin/Medications Dosing Guide"  in media tab for additional information. [name] participated in the discussions, expressed understanding, and voiced agreement with the above plans.  All questions were answered to her satisfaction. she is encouraged to contact clinic should she have any  questions or concerns prior to her return visit.   Follow up plan: Return in about 4 months (around 11/05/2018) for Meter, and Logs.   Glade Lloyd, MD [facility]  This note was partially dictated with voice recognition software. Similar sounding words can be transcribed inadequately or may not  be corrected upon review.

## 2018-08-25 ENCOUNTER — Other Ambulatory Visit: Payer: Self-pay | Admitting: "Endocrinology

## 2018-09-06 ENCOUNTER — Other Ambulatory Visit: Payer: Self-pay | Admitting: "Endocrinology

## 2018-10-28 ENCOUNTER — Other Ambulatory Visit: Payer: Self-pay | Admitting: "Endocrinology

## 2018-10-29 LAB — SPECIMEN STATUS REPORT

## 2018-10-29 LAB — COMPREHENSIVE METABOLIC PANEL
ALT: 20 IU/L (ref 0–32)
AST: 14 IU/L (ref 0–40)
Albumin/Globulin Ratio: 1.7 (ref 1.2–2.2)
Albumin: 4.5 g/dL (ref 3.8–4.9)
Alkaline Phosphatase: 105 IU/L (ref 39–117)
BUN/Creatinine Ratio: 15 (ref 9–23)
BUN: 11 mg/dL (ref 6–24)
Bilirubin Total: 0.7 mg/dL (ref 0.0–1.2)
CO2: 26 mmol/L (ref 20–29)
Calcium: 9.8 mg/dL (ref 8.7–10.2)
Chloride: 103 mmol/L (ref 96–106)
Creatinine, Ser: 0.72 mg/dL (ref 0.57–1.00)
GFR calc Af Amer: 108 mL/min/{1.73_m2} (ref >59)
GFR calc non Af Amer: 94 mL/min/{1.73_m2} (ref >59)
Globulin, Total: 2.6 g/dL (ref 1.5–4.5)
Glucose: 186 mg/dL — ABNORMAL HIGH (ref 65–99)
Potassium: 4.3 mmol/L (ref 3.5–5.2)
Sodium: 142 mmol/L (ref 134–144)
Total Protein: 7.1 g/dL (ref 6.0–8.5)

## 2018-10-29 LAB — HGB A1C W/O EAG: Hgb A1c MFr Bld: 9.2 % — ABNORMAL HIGH (ref 4.8–5.6)

## 2018-11-05 ENCOUNTER — Ambulatory Visit (INDEPENDENT_AMBULATORY_CARE_PROVIDER_SITE_OTHER): Payer: 59 | Admitting: "Endocrinology

## 2018-11-05 ENCOUNTER — Encounter: Payer: Self-pay | Admitting: "Endocrinology

## 2018-11-05 ENCOUNTER — Other Ambulatory Visit: Payer: Self-pay

## 2018-11-05 VITALS — BP 130/81 | HR 69 | Ht 67.0 in | Wt 171.0 lb

## 2018-11-05 DIAGNOSIS — E782 Mixed hyperlipidemia: Secondary | ICD-10-CM

## 2018-11-05 DIAGNOSIS — E118 Type 2 diabetes mellitus with unspecified complications: Secondary | ICD-10-CM

## 2018-11-05 DIAGNOSIS — E1165 Type 2 diabetes mellitus with hyperglycemia: Secondary | ICD-10-CM | POA: Diagnosis not present

## 2018-11-05 DIAGNOSIS — I10 Essential (primary) hypertension: Secondary | ICD-10-CM | POA: Diagnosis not present

## 2018-11-05 DIAGNOSIS — IMO0002 Reserved for concepts with insufficient information to code with codable children: Secondary | ICD-10-CM

## 2018-11-05 DIAGNOSIS — Z794 Long term (current) use of insulin: Secondary | ICD-10-CM

## 2018-11-05 MED ORDER — INSULIN LISPRO (1 UNIT DIAL) 100 UNIT/ML (KWIKPEN): 8.0000 [IU] | PEN_INJECTOR | Freq: Three times a day (TID) | SUBCUTANEOUS | 2 refills | Status: DC

## 2018-11-05 MED ORDER — DEXCOM G6 RECEIVER DEVI: 1.0000 | 0 refills | Status: DC | PRN

## 2018-11-05 MED ORDER — DEXCOM G6 SENSOR MISC: 4.0000 | 2 refills | Status: DC

## 2018-11-05 NOTE — Patient Instructions (Signed)

## 2018-11-05 NOTE — Progress Notes (Signed)
Endocrinology follow-up note   Subjective:    Patient ID: Vicki Ward, female    DOB: Feb 04, 1961, 58 y.o.   MRN: [id number]   Vicki Ward is a 58 y.o. female presenting on 11/05/2018 for Follow-up (DM type 2)  Diabetes  She presents for her follow-up diabetic visit. She has type 2 diabetes mellitus. Onset time: She was diagnosed at approximate age of 40 years. Her disease course has been worsening. There are no hypoglycemic associated symptoms. Pertinent negatives for hypoglycemia include no headaches or pallor. Pertinent negatives for diabetes include no chest pain, no fatigue, no foot ulcerations, no polyphagia and no weight loss. There are no hypoglycemic complications. Symptoms are worsening. There are no diabetic complications. Risk factors for coronary artery disease include diabetes mellitus, dyslipidemia and hypertension. Current diabetic treatment includes insulin injections and oral agent (monotherapy). She is compliant with treatment most of the time. Her weight is increasing steadily. She is following a diabetic diet. She has had a previous visit with a dietitian. She participates in exercise intermittently. Her home blood glucose trend is fluctuating minimally. Her breakfast blood glucose range is generally 180-200 mg/dl. Her lunch blood glucose range is generally 180-200 mg/dl. Her dinner blood glucose range is generally 180-200 mg/dl. Her bedtime blood glucose range is generally 180-200 mg/dl. Her overall blood glucose range is 180-200 mg/dl. An ACE inhibitor/angiotensin II receptor blocker is being taken. Eye exam is current.  Hypertension  This is a chronic problem. Pertinent negatives include no chest pain, headaches, palpitations or shortness of breath. Risk factors for coronary artery disease include dyslipidemia, diabetes mellitus and sedentary lifestyle. Past treatments include ACE inhibitors. The current treatment provides moderate improvement. There are no compliance  problems.   Hyperlipidemia  This is a chronic problem. The current episode started more than 1 year ago. The problem is controlled. Pertinent negatives include no chest pain or shortness of breath. Current antihyperlipidemic treatment includes statins. The current treatment provides moderate improvement of lipids. There are no compliance problems.  Risk factors for coronary artery disease include diabetes mellitus, dyslipidemia and hypertension.    Review of Systems  Constitutional: Negative for fatigue and weight loss.  HENT: Negative for dental problem, mouth sores and trouble swallowing.   Eyes: Negative for visual disturbance.  Respiratory: Negative for cough and shortness of breath.   Cardiovascular: Negative for chest pain, palpitations and leg swelling.  Endocrine: Negative for polyphagia.  Musculoskeletal: Negative for back pain and gait problem.  Skin: Negative for color change, pallor, rash and wound.  Neurological: Negative for headaches.      Objective:    BP 130/81   Pulse 69   Ht 5\' 7"  (1.702 m)   Wt 171 lb (77.6 kg)   BMI 26.78 kg/m   Wt Readings from Last 3 Encounters:  11/05/18 171 lb (77.6 kg)  07/07/18 167 lb (75.8 kg)  04/03/18 158 lb (71.7 kg)    Physical Exam  Constitutional: She is oriented to person, place, and time. She appears well-developed and well-nourished.  HENT:  Head: Normocephalic and atraumatic.  Eyes: EOM are normal.  Neck: Normal range of motion. Neck supple.  Cardiovascular: Normal rate and regular rhythm.  Pulmonary/Chest: Effort normal. No respiratory distress. She has no wheezes.  Abdominal: She exhibits no distension. There is no guarding.  Musculoskeletal: Normal range of motion.  Neurological: She is alert and oriented to person, place, and time. She has normal reflexes.  Skin: Skin is warm and dry. No rash noted. No  erythema. No pallor.  Psychiatric: She has a normal mood and affect. Her behavior is normal. Judgment and thought  content normal.    Results for orders placed or performed in visit on 10/28/18  Comprehensive metabolic panel  Result Value Ref Range   Glucose 186 (H) 65 - 99 mg/dL   BUN 11 6 - 24 mg/dL   Creatinine, Ser 0.72 0.57 - 1.00 mg/dL   GFR calc non Af Amer 94 >59 mL/min/1.73   GFR calc Af Amer 108 >59 mL/min/1.73   BUN/Creatinine Ratio 15 9 - 23   Sodium 142 134 - 144 mmol/L   Potassium 4.3 3.5 - 5.2 mmol/L   Chloride 103 96 - 106 mmol/L   CO2 26 20 - 29 mmol/L   Calcium 9.8 8.7 - 10.2 mg/dL   Total Protein 7.1 6.0 - 8.5 g/dL   Albumin 4.5 3.8 - 4.9 g/dL   Globulin, Total 2.6 1.5 - 4.5 g/dL   Albumin/Globulin Ratio 1.7 1.2 - 2.2   Bilirubin Total 0.7 0.0 - 1.2 mg/dL   Alkaline Phosphatase 105 39 - 117 IU/L   AST 14 0 - 40 IU/L   ALT 20 0 - 32 IU/L  Hgb A1c w/o eAG  Result Value Ref Range   Hgb A1c MFr Bld 9.2 (H) 4.8 - 5.6 %  Specimen status report  Result Value Ref Range   specimen status report Comment    Complete Blood Count (Most recent): Lab Results  Component Value Date   WBC 11.0 (H) 05/12/2008   HGB 13.1 05/12/2008   HCT 38.8 05/12/2008   MCV 94.3 05/12/2008   PLT 287 05/12/2008    Lipid Panel     Component Value Date/Time   CHOL 179 03/26/2018 1106   TRIG 63 03/26/2018 1106   HDL 71 03/26/2018 1106   CHOLHDL 2.0 04/02/2016 1037   LDLCALC 95 03/26/2018 1106    Diagnosis:  1. uncontrolled type 2 diabetes 2.  Hyperlipidemia 3. Hypertension    Assessment & Plan:   1. Uncontrolled type 2 DM: -She came with fluctuating glycemic profile, significant for an predictable insulin sensitivity.    Her previsit labs show A1c of 9.2% increasing from 8% during her last visit.   I have counseled the patient  to stay on diet management by adopting a carbohydrate restricted diet. Her body weight is appropriate at this time.   -She is approached for slight increase in her insulin doses in order to achieve control of diabetes to target. -She is advised to continue  Basaglar 40 units nightly, increase Humalog to 8-11  units 3 times daily for meals for pre-meal blood glucose readings above 90 mg/dL.   -She is asked to document  blood glucose 4 times a day-before meals and at bedtime.  -She is advised to continue Metformin 1000 mg by mouth twice a day for now.  Her anti-GAD antibodies < 5 .   -Target numbers for a1c, LDL, HDL, Triglycerides, waist circumference were discussed in detail.   2. HTN: Her blood pressure is controlled to target.  She is advised to continue her current medications including enalapril 5 mg p.o. daily at breakfast and salt restrictions.   3. Hyperlipidemia: Her recent lipid panel showed uncontrolled LDL at 95.   She is advised to continue simvastatin 20 mg p.o. nightly.  She will be considered for fasting lipid panel on subsequent visits.    4. Chronic care management: -Patient is on ACEI and Statin medications and encouraged to continue  to follow up with Ophthalmology at least yearly or according to recommendations, and advised to stay away from smoking.  -Patient is advised to continue f/u with her PMD for primary care needs. - Time spent with the patient: 25 min, of which >50% was spent in reviewing her blood glucose logs , discussing her hypoglycemia and hyperglycemia episodes, reviewing her current and  previous labs / studies and medications  doses and developing a plan to avoid hypoglycemia and hyperglycemia. Please refer to Patient Instructions for Blood Glucose Monitoring and Insulin/Medications Dosing Guide"  in media tab for additional information. Please  also refer to " Patient Self Inventory" in the Media  tab for reviewed elements of pertinent patient history.  [name] participated in the discussions, expressed understanding, and voiced agreement with the above plans.  All questions were answered to her satisfaction. she is encouraged to contact clinic should she have any questions or concerns prior to her return  visit.   Follow up plan: Return in about 3 months (around 02/05/2019) for Follow up with Pre-visit Labs, Meter, and Logs.   Glade Lloyd, MD [facility]  This note was partially dictated with voice recognition software. Similar sounding words can be transcribed inadequately or may not  be corrected upon review.

## 2018-11-10 ENCOUNTER — Telehealth: Payer: Self-pay | Admitting: "Endocrinology

## 2018-11-10 NOTE — Telephone Encounter (Signed)
Pt said pharmacy did not receive Continuous Blood Gluc Receiver ([place]) Desha. Please re-send.   [facility] [place]

## 2018-11-10 NOTE — Telephone Encounter (Signed)
Route

## 2018-11-11 MED ORDER — DEXCOM G6 RECEIVER DEVI: 1.0000 | 0 refills | Status: DC | PRN

## 2018-11-11 MED ORDER — DEXCOM G6 SENSOR MISC: 4.0000 | 2 refills | Status: DC

## 2018-11-11 NOTE — Telephone Encounter (Signed)
Rx Sent  

## 2018-12-05 ENCOUNTER — Other Ambulatory Visit: Payer: Self-pay | Admitting: "Endocrinology

## 2019-01-06 ENCOUNTER — Other Ambulatory Visit: Payer: Self-pay | Admitting: "Endocrinology

## 2019-01-17 ENCOUNTER — Other Ambulatory Visit: Payer: Self-pay | Admitting: "Endocrinology

## 2019-02-02 ENCOUNTER — Other Ambulatory Visit: Payer: Self-pay | Admitting: "Endocrinology

## 2019-02-03 LAB — COMPREHENSIVE METABOLIC PANEL WITH GFR
ALT: 29 [IU]/L (ref 0–32)
AST: 25 [IU]/L (ref 0–40)
Albumin/Globulin Ratio: 1.3 (ref 1.2–2.2)
Albumin: 4.3 g/dL (ref 3.8–4.9)
Alkaline Phosphatase: 94 [IU]/L (ref 39–117)
BUN/Creatinine Ratio: 14 (ref 9–23)
BUN: 11 mg/dL (ref 6–24)
Bilirubin Total: 0.5 mg/dL (ref 0.0–1.2)
CO2: 26 mmol/L (ref 20–29)
Calcium: 9.7 mg/dL (ref 8.7–10.2)
Chloride: 105 mmol/L (ref 96–106)
Creatinine, Ser: 0.76 mg/dL (ref 0.57–1.00)
GFR calc Af Amer: 101 mL/min/{1.73_m2}
GFR calc non Af Amer: 88 mL/min/{1.73_m2}
Globulin, Total: 3.2 g/dL (ref 1.5–4.5)
Glucose: 59 mg/dL — ABNORMAL LOW (ref 65–99)
Potassium: 4.1 mmol/L (ref 3.5–5.2)
Sodium: 144 mmol/L (ref 134–144)
Total Protein: 7.5 g/dL (ref 6.0–8.5)

## 2019-02-03 LAB — HGB A1C W/O EAG: Hgb A1c MFr Bld: 7.6 % — ABNORMAL HIGH (ref 4.8–5.6)

## 2019-02-08 ENCOUNTER — Ambulatory Visit: Payer: 59 | Admitting: "Endocrinology

## 2019-02-10 ENCOUNTER — Other Ambulatory Visit: Payer: Self-pay

## 2019-02-10 ENCOUNTER — Ambulatory Visit (INDEPENDENT_AMBULATORY_CARE_PROVIDER_SITE_OTHER): Payer: 59 | Admitting: "Endocrinology

## 2019-02-10 ENCOUNTER — Encounter: Payer: Self-pay | Admitting: "Endocrinology

## 2019-02-10 DIAGNOSIS — I10 Essential (primary) hypertension: Secondary | ICD-10-CM

## 2019-02-10 DIAGNOSIS — IMO0002 Reserved for concepts with insufficient information to code with codable children: Secondary | ICD-10-CM

## 2019-02-10 DIAGNOSIS — E118 Type 2 diabetes mellitus with unspecified complications: Secondary | ICD-10-CM | POA: Diagnosis not present

## 2019-02-10 DIAGNOSIS — E1165 Type 2 diabetes mellitus with hyperglycemia: Secondary | ICD-10-CM | POA: Diagnosis not present

## 2019-02-10 DIAGNOSIS — E782 Mixed hyperlipidemia: Secondary | ICD-10-CM

## 2019-02-10 DIAGNOSIS — Z794 Long term (current) use of insulin: Secondary | ICD-10-CM

## 2019-02-10 MED ORDER — SIMVASTATIN 20 MG PO TABS: 20.0000 mg | ORAL_TABLET | Freq: Every evening | ORAL | 3 refills | Status: DC

## 2019-02-10 MED ORDER — ENALAPRIL MALEATE 5 MG PO TABS: 5.0000 mg | ORAL_TABLET | Freq: Every day | ORAL | 3 refills | Status: DC

## 2019-02-10 NOTE — Progress Notes (Signed)
02/10/2019                                                    Endocrinology Telehealth Visit Follow up Note -During COVID -19 Pandemic  This visit type was conducted due to national recommendations for restrictions regarding the COVID-19 Pandemic  in an effort to limit this patient's exposure and mitigate transmission of the corona virus.  Due to her co-morbid illnesses, Vicki Ward is at  moderate to high risk for complications without adequate follow up.  This format is felt to be most appropriate for her at this time.  I connected with this patient on 02/10/2019   by telephone and verified that I am speaking with the correct person using two identifiers. Vicki Ward, 02-24-1961. she has verbally consented to this visit. All issues noted in this document were discussed and addressed. The format was not optimal for physical exam.    Subjective:    Patient ID: Vicki Ward, female    DOB: 09/06/1960, 58 y.o.   MRN: [id number]   Vicki Ward is a 58 y.o. female presenting on 02/10/2019 for No chief complaint on file.  Diabetes She presents for her follow-up diabetic visit. She has type 2 diabetes mellitus. Onset time: She was diagnosed at approximate age of 81 years. Her disease course has been improving. There are no hypoglycemic associated symptoms. Pertinent negatives for hypoglycemia include no headaches or pallor. Pertinent negatives for diabetes include no chest pain, no fatigue, no foot ulcerations, no polyphagia and no weight loss. There are no hypoglycemic complications. Symptoms are improving. There are no diabetic complications. Risk factors for coronary artery disease include diabetes mellitus, dyslipidemia and hypertension. Current diabetic treatment includes insulin injections and oral agent (monotherapy). She is compliant with treatment most of the time. Her weight is stable. She is following a diabetic diet. She has had a previous visit with a dietitian. She participates in  exercise intermittently. Her home blood glucose trend is fluctuating minimally. Her breakfast blood glucose range is generally 130-140 mg/dl. Her lunch blood glucose range is generally 130-140 mg/dl. Her dinner blood glucose range is generally 130-140 mg/dl. Her bedtime blood glucose range is generally 180-200 mg/dl. Her overall blood glucose range is 140-180 mg/dl. An ACE inhibitor/angiotensin II receptor blocker is being taken. Eye exam is current.  Hypertension This is a chronic problem. Pertinent negatives include no chest pain, headaches, palpitations or shortness of breath. Risk factors for coronary artery disease include dyslipidemia, diabetes mellitus and sedentary lifestyle. Past treatments include ACE inhibitors. The current treatment provides moderate improvement. There are no compliance problems.   Hyperlipidemia This is a chronic problem. The current episode started more than 1 year ago. The problem is controlled. Pertinent negatives include no chest pain or shortness of breath. Current antihyperlipidemic treatment includes statins. The current treatment provides moderate improvement of lipids. There are no compliance problems.  Risk factors for coronary artery disease include diabetes mellitus, dyslipidemia and hypertension.    Review of systems: Limited as above.     Objective:    There were no vitals taken for this visit.  Wt Readings from Last 3 Encounters:  11/05/18 171 lb (77.6 kg)  07/07/18 167 lb (75.8 kg)  04/03/18 158 lb (71.7 kg)    Physical Exam  Constitutional: She is oriented to person, place,  and time. She appears well-developed and well-nourished.  HENT:  Head: Normocephalic and atraumatic.  Eyes: EOM are normal.  Neck: Normal range of motion. Neck supple.  Cardiovascular: Normal rate and regular rhythm.  Pulmonary/Chest: Effort normal. No respiratory distress. She has no wheezes.  Abdominal: She exhibits no distension. There is no guarding.  Musculoskeletal:  Normal range of motion.  Neurological: She is alert and oriented to person, place, and time. She has normal reflexes.  Skin: Skin is warm and dry. No rash noted. No erythema. No pallor.  Psychiatric: She has a normal mood and affect. Her behavior is normal. Judgment and thought content normal.    Results for orders placed or performed in visit on 02/02/19  Comprehensive metabolic panel  Result Value Ref Range   Glucose 59 (L) 65 - 99 mg/dL   BUN 11 6 - 24 mg/dL   Creatinine, Ser 0.76 0.57 - 1.00 mg/dL   GFR calc non Af Amer 88 >59 mL/min/1.73   GFR calc Af Amer 101 >59 mL/min/1.73   BUN/Creatinine Ratio 14 9 - 23   Sodium 144 134 - 144 mmol/L   Potassium 4.1 3.5 - 5.2 mmol/L   Chloride 105 96 - 106 mmol/L   CO2 26 20 - 29 mmol/L   Calcium 9.7 8.7 - 10.2 mg/dL   Total Protein 7.5 6.0 - 8.5 g/dL   Albumin 4.3 3.8 - 4.9 g/dL   Globulin, Total 3.2 1.5 - 4.5 g/dL   Albumin/Globulin Ratio 1.3 1.2 - 2.2   Bilirubin Total 0.5 0.0 - 1.2 mg/dL   Alkaline Phosphatase 94 39 - 117 IU/L   AST 25 0 - 40 IU/L   ALT 29 0 - 32 IU/L  Hgb A1c w/o eAG  Result Value Ref Range   Hgb A1c MFr Bld 7.6 (H) 4.8 - 5.6 %   Complete Blood Count (Most recent): Lab Results  Component Value Date   WBC 11.0 (H) 05/12/2008   HGB 13.1 05/12/2008   HCT 38.8 05/12/2008   MCV 94.3 05/12/2008   PLT 287 05/12/2008    Lipid Panel     Component Value Date/Time   CHOL 179 03/26/2018 1106   TRIG 63 03/26/2018 1106   HDL 71 03/26/2018 1106   CHOLHDL 2.0 04/02/2016 1037   LDLCALC 95 03/26/2018 1106    Diagnosis:  1. uncontrolled type 2 diabetes 2.  Hyperlipidemia 3. Hypertension    Assessment & Plan:   1. Uncontrolled type 2 DM: -She came with fluctuating glycemic profile, significant for an predictable insulin sensitivity.    Her previsit labs show improved A1c of 7.6%, decreasing from 9.2%.    I have counseled the patient  to stay on diet management by adopting a carbohydrate restricted diet. Her  body weight is appropriate at this time.   -She is advised to continue Basaglar 40 units nightly, continue Humalog  8-11  units 3 times daily for meals for pre-meal blood glucose readings above 90 mg/dL.   -She is asked to document  blood glucose 4 times a day-before meals and at bedtime, using her CGM device.  -She is advised to continue Metformin 1000 mg by mouth twice a day for now.  Her anti-GAD antibodies < 5 .   -Target numbers for a1c, LDL, HDL, Triglycerides, waist circumference were discussed in detail.   2. HTN: she is advised to home monitor blood pressure and report if > 140/90 on 2 separate readings.   She is advised to continue her current medications  including enalapril 5 mg p.o. daily at breakfast and salt restrictions.    3. Hyperlipidemia: Her recent lipid panel showed uncontrolled LDL at 95.   She is advised to continue simvastatin 20 mg p.o. nightly.   She will be considered for fasting lipid panel on subsequent visits.   4. Chronic care management: -Patient is on ACEI and Statin medications and encouraged to continue to follow up with Ophthalmology at least yearly or according to recommendations, and advised to stay away from smoking.  -Patient is advised to continue f/u with her PMD for primary care needs.  - Patient Care Time Today:  25 min, of which >50% was spent in  counseling and the rest reviewing her  current and  previous labs/studies, previous treatments, her blood glucose readings, and medications' doses and developing a plan for long-term care based on the latest recommendations for standards of care.   Vicki Ward participated in the discussions, expressed understanding, and voiced agreement with the above plans.  All questions were answered to her satisfaction. she is encouraged to contact clinic should she have any questions or concerns prior to her return visit.   Follow up plan: Return in about 4 months (around 06/12/2019) for Bring Meter and Logs-  A1c in Office, Include 6 log sheets.   Glade Lloyd, MD [facility]  This note was partially dictated with voice recognition software. Similar sounding words can be transcribed inadequately or may not  be corrected upon review.

## 2019-02-28 ENCOUNTER — Other Ambulatory Visit: Payer: Self-pay | Admitting: "Endocrinology

## 2019-05-25 ENCOUNTER — Other Ambulatory Visit: Payer: Self-pay | Admitting: "Endocrinology

## 2019-06-01 ENCOUNTER — Other Ambulatory Visit: Payer: Self-pay

## 2019-06-01 ENCOUNTER — Ambulatory Visit: Payer: 59 | Attending: Internal Medicine

## 2019-06-01 DIAGNOSIS — Z20822 Contact with and (suspected) exposure to covid-19: Secondary | ICD-10-CM

## 2019-06-02 LAB — NOVEL CORONAVIRUS, NAA: SARS-CoV-2, NAA: NOT DETECTED

## 2019-06-16 ENCOUNTER — Ambulatory Visit: Payer: 59 | Admitting: "Endocrinology

## 2019-06-18 ENCOUNTER — Other Ambulatory Visit: Payer: Self-pay | Admitting: "Endocrinology

## 2019-06-19 ENCOUNTER — Other Ambulatory Visit: Payer: Self-pay | Admitting: "Endocrinology

## 2019-06-22 ENCOUNTER — Ambulatory Visit: Payer: 59 | Admitting: "Endocrinology

## 2019-06-22 ENCOUNTER — Other Ambulatory Visit: Payer: Self-pay

## 2019-06-22 ENCOUNTER — Other Ambulatory Visit: Payer: Self-pay | Admitting: "Endocrinology

## 2019-06-22 ENCOUNTER — Encounter: Payer: Self-pay | Admitting: "Endocrinology

## 2019-06-22 VITALS — BP 144/89 | HR 71 | Temp 97.8°F | Ht 67.5 in | Wt 180.0 lb

## 2019-06-22 DIAGNOSIS — E118 Type 2 diabetes mellitus with unspecified complications: Secondary | ICD-10-CM | POA: Diagnosis not present

## 2019-06-22 DIAGNOSIS — Z794 Long term (current) use of insulin: Secondary | ICD-10-CM | POA: Diagnosis not present

## 2019-06-22 DIAGNOSIS — IMO0002 Reserved for concepts with insufficient information to code with codable children: Secondary | ICD-10-CM

## 2019-06-22 DIAGNOSIS — E1165 Type 2 diabetes mellitus with hyperglycemia: Secondary | ICD-10-CM

## 2019-06-22 LAB — POCT GLYCOSYLATED HEMOGLOBIN (HGB A1C): Hemoglobin A1C: 8.9 % — AB (ref 4.0–5.6)

## 2019-06-22 MED ORDER — BASAGLAR KWIKPEN 100 UNIT/ML ~~LOC~~ SOPN: 44.0000 [IU] | PEN_INJECTOR | Freq: Every day | SUBCUTANEOUS | 2 refills | Status: DC

## 2019-06-22 MED ORDER — ENALAPRIL MALEATE 10 MG PO TABS: 10.0000 mg | ORAL_TABLET | Freq: Every day | ORAL | 1 refills | Status: DC

## 2019-06-22 MED ORDER — SIMVASTATIN 20 MG PO TABS: ORAL_TABLET | ORAL | 1 refills | Status: DC

## 2019-06-22 NOTE — Progress Notes (Signed)
06/22/2019   Endocrinology follow-up note    Subjective:    Patient ID: Vicki Ward, female    DOB: 1960/08/10, 59 y.o.   MRN: [id number]   Vicki Ward is a 59 y.o. female presenting on 06/22/2019 for Diabetes (follow up)  Diabetes She presents for her follow-up diabetic visit. She has type 2 diabetes mellitus. Onset time: She was diagnosed at approximate age of 45 years. Her disease course has been worsening. There are no hypoglycemic associated symptoms. Pertinent negatives for hypoglycemia include no headaches or pallor. Pertinent negatives for diabetes include no chest pain, no fatigue, no foot ulcerations, no polyphagia and no weight loss. There are no hypoglycemic complications. Symptoms are worsening. There are no diabetic complications. Risk factors for coronary artery disease include diabetes mellitus, dyslipidemia and hypertension. Current diabetic treatment includes insulin injections and oral agent (monotherapy). She is compliant with treatment most of the time. Her weight is increasing steadily. She is following a diabetic diet. She has had a previous visit with a dietitian. She participates in exercise intermittently. Her home blood glucose trend is fluctuating minimally. Her breakfast blood glucose range is generally >200 mg/dl. Her lunch blood glucose range is generally >200 mg/dl. Her dinner blood glucose range is generally >200 mg/dl. Her bedtime blood glucose range is generally >200 mg/dl. Her overall blood glucose range is >200 mg/dl. (Her Dexcom CGM analysis shows her average blood glucose is 204, Point-of-care A1c today is 8.9%. 78% time in range, 20% slightly above range.) An ACE inhibitor/angiotensin II receptor blocker is being taken. Eye exam is current.  Hypertension This is a chronic problem. Pertinent negatives include no chest pain, headaches, palpitations or shortness of breath. Risk factors for coronary artery disease include dyslipidemia, diabetes mellitus  and sedentary lifestyle. Past treatments include ACE inhibitors. The current treatment provides moderate improvement. There are no compliance problems.   Hyperlipidemia This is a chronic problem. The current episode started more than 1 year ago. The problem is controlled. Pertinent negatives include no chest pain or shortness of breath. Current antihyperlipidemic treatment includes statins. The current treatment provides moderate improvement of lipids. There are no compliance problems.  Risk factors for coronary artery disease include diabetes mellitus, dyslipidemia and hypertension.     Review of systems Constitutional: + weight gain, no fatigue, no subjective hyperthermia, no subjective hypothermia Eyes: no blurry vision, no xerophthalmia ENT: no sore throat, no nodules palpated in throat, no dysphagia/odynophagia, no hoarseness Cardiovascular: no Chest Pain, no Shortness of Breath, no palpitations, no leg swelling Respiratory: no cough, no SOB Gastrointestinal: no Nausea/Vomiting/Diarhhea Musculoskeletal: no muscle/joint aches Skin: no rashes Neurological: no tremors, no numbness, no tingling, no dizziness Psychiatric: no depression, no anxiety      Objective:    BP (!) 144/89 (BP Location: Right Arm, Patient Position: Sitting, Cuff Size: Normal)   Pulse 71   Temp 97.8 F (36.6 C) (Oral)   Ht 5' 7.5" (1.715 m)   Wt 180 lb (81.6 kg)   SpO2 98%   BMI 27.78 kg/m   Wt Readings from Last 3 Encounters:  06/22/19 180 lb (81.6 kg)  11/05/18 171 lb (77.6 kg)  07/07/18 167 lb (75.8 kg)    Physical Exam  Constitutional: She is oriented to person, place, and time. She appears well-developed and well-nourished.  HENT:  Head: Normocephalic and atraumatic.  Eyes: EOM are normal.  Cardiovascular: Normal rate and regular rhythm.  Pulmonary/Chest: Effort normal. No respiratory distress. She has no wheezes.  Abdominal: She exhibits no  distension. There is no guarding.  Musculoskeletal:         General: Normal range of motion.     Cervical back: Normal range of motion and neck supple.  Neurological: She is alert and oriented to person, place, and time. She has normal reflexes.  Skin: Skin is warm and dry. No rash noted. No erythema. No pallor.  Psychiatric: She has a normal mood and affect. Her behavior is normal. Judgment and thought content normal.    Results for orders placed or performed in visit on 06/22/19  HgB A1c  Result Value Ref Range   Hemoglobin A1C 8.9 (A) 4.0 - 5.6 %   HbA1c POC (<> result, manual entry)     HbA1c, POC (prediabetic range)     HbA1c, POC (controlled diabetic range)     Complete Blood Count (Most recent): Lab Results  Component Value Date   WBC 11.0 (H) 05/12/2008   HGB 13.1 05/12/2008   HCT 38.8 05/12/2008   MCV 94.3 05/12/2008   PLT 287 05/12/2008    Lipid Panel     Component Value Date/Time   CHOL 179 03/26/2018 1106   TRIG 63 03/26/2018 1106   HDL 71 03/26/2018 1106   CHOLHDL 2.0 04/02/2016 1037   LDLCALC 95 03/26/2018 1106    Diagnosis:  1. uncontrolled type 2 diabetes 2.  Hyperlipidemia 3. Hypertension    Assessment & Plan:   1. Uncontrolled type 2 DM: -She came with fluctuating glycemic profile, significant for an predictable insulin sensitivity.   I reviewed her CGM analysis, see above. Her point-of-care A1c is 8.9% increasing from 7.6%. I have counseled the patient  to stay on diet management by adopting a carbohydrate restricted diet. Her body weight is appropriate at this time.   -She is advised to increase her Basaglar to 44 units nightly, continue  Humalog  8-11  units 3 times daily for meals for pre-meal blood glucose readings above 70 mg/dL.   -She is asked to document  blood glucose 4 times a day-before meals and at bedtime, using her Dexcom CGM device.  -She is advised to continue Metformin 1000 mg by mouth twice a day for now.  Her anti-GAD antibodies < 5 .   -Target numbers for a1c, LDL, HDL,  Triglycerides, waist circumference were discussed in detail.   2. HTN: Her blood pressure is above target today. She is advised to increase her enalapril to 10 mg p.o. daily at breakfast, and continue salt restrictions.  3. Hyperlipidemia: Her recent lipid panel showed uncontrolled LDL at 95.   She is advised to continue simvastatin 20 mg p.o. nightly.   She will be considered for fasting lipid panel on subsequent visits.   4. Chronic care management: -Patient is on ACEI and Statin medications and encouraged to continue to follow up with Ophthalmology at least yearly or according to recommendations, and advised to stay away from smoking.  -Patient is advised to continue f/u with her PMD for primary care needs.  - Time spent on this patient care encounter:  35 min, of which >50% was spent in  counseling and the rest reviewing her  current and  previous labs/studies ( including abstraction from other facilities),  previous treatments, her blood glucose readings, and medications' doses and developing a plan for long-term care based on the latest recommendations for standards of care; and documenting her care.  [name] participated in the discussions, expressed understanding, and voiced agreement with the above plans.  All questions  were answered to her satisfaction. she is encouraged to contact clinic should she have any questions or concerns prior to her return visit.  Follow up plan: Return in about 4 months (around 10/20/2019) for Bring Meter and Logs- A1c in Office.   Glade Lloyd, MD [facility]  This note was partially dictated with voice recognition software. Similar sounding words can be transcribed inadequately or may not  be corrected upon review.

## 2019-06-24 ENCOUNTER — Other Ambulatory Visit: Payer: 59

## 2019-06-24 ENCOUNTER — Other Ambulatory Visit: Payer: Self-pay | Admitting: "Endocrinology

## 2019-06-24 MED ORDER — FREESTYLE LIBRE SENSOR SYSTEM MISC: 2 refills | Status: DC

## 2019-06-24 MED ORDER — FREESTYLE LIBRE READER DEVI: 1.0000 | Freq: Once | 0 refills | Status: AC

## 2019-07-08 ENCOUNTER — Other Ambulatory Visit: Payer: Self-pay | Admitting: "Endocrinology

## 2019-07-15 ENCOUNTER — Other Ambulatory Visit: Payer: Self-pay | Admitting: "Endocrinology

## 2019-07-15 MED ORDER — FREESTYLE LIBRE READER DEVI: 1.0000 | Freq: Once | 0 refills | Status: AC

## 2019-07-15 MED ORDER — FREESTYLE LIBRE SENSOR SYSTEM MISC: 2 refills | Status: DC

## 2019-10-14 ENCOUNTER — Other Ambulatory Visit: Payer: Self-pay | Admitting: "Endocrinology

## 2019-10-15 LAB — VITAMIN D 25 HYDROXY (VIT D DEFICIENCY, FRACTURES): Vit D, 25-Hydroxy: 66.1 ng/mL (ref 30.0–100.0)

## 2019-10-15 LAB — T4, FREE: Free T4: 1.5 ng/dL (ref 0.82–1.77)

## 2019-10-15 LAB — COMPREHENSIVE METABOLIC PANEL
ALT: 19 IU/L (ref 0–32)
AST: 16 IU/L (ref 0–40)
Albumin/Globulin Ratio: 1.5 (ref 1.2–2.2)
Albumin: 4.1 g/dL (ref 3.8–4.9)
Alkaline Phosphatase: 97 IU/L (ref 39–117)
BUN/Creatinine Ratio: 18 (ref 9–23)
BUN: 13 mg/dL (ref 6–24)
Bilirubin Total: 0.5 mg/dL (ref 0.0–1.2)
CO2: 24 mmol/L (ref 20–29)
Calcium: 9.5 mg/dL (ref 8.7–10.2)
Chloride: 103 mmol/L (ref 96–106)
Creatinine, Ser: 0.71 mg/dL (ref 0.57–1.00)
GFR calc Af Amer: 109 mL/min/{1.73_m2} (ref >59)
GFR calc non Af Amer: 95 mL/min/{1.73_m2} (ref >59)
Globulin, Total: 2.7 g/dL (ref 1.5–4.5)
Glucose: 164 mg/dL — ABNORMAL HIGH (ref 65–99)
Potassium: 4.4 mmol/L (ref 3.5–5.2)
Sodium: 141 mmol/L (ref 134–144)
Total Protein: 6.8 g/dL (ref 6.0–8.5)

## 2019-10-15 LAB — MICROALBUMIN / CREATININE URINE RATIO
Creatinine, Urine: 190.1 mg/dL
Microalb/Creat Ratio: 7 mg/g creat (ref 0–29)
Microalbumin, Urine: 13.2 ug/mL

## 2019-10-15 LAB — TSH: TSH: 1.03 u[IU]/mL (ref 0.450–4.500)

## 2019-10-15 LAB — SPECIMEN STATUS REPORT

## 2019-10-21 ENCOUNTER — Encounter: Payer: Self-pay | Admitting: "Endocrinology

## 2019-10-21 ENCOUNTER — Other Ambulatory Visit: Payer: Self-pay

## 2019-10-21 ENCOUNTER — Ambulatory Visit (INDEPENDENT_AMBULATORY_CARE_PROVIDER_SITE_OTHER): Payer: 59 | Admitting: "Endocrinology

## 2019-10-21 VITALS — BP 134/81 | HR 70 | Ht 67.5 in | Wt 177.4 lb

## 2019-10-21 DIAGNOSIS — I10 Essential (primary) hypertension: Secondary | ICD-10-CM

## 2019-10-21 DIAGNOSIS — E782 Mixed hyperlipidemia: Secondary | ICD-10-CM | POA: Diagnosis not present

## 2019-10-21 DIAGNOSIS — Z794 Long term (current) use of insulin: Secondary | ICD-10-CM

## 2019-10-21 DIAGNOSIS — IMO0002 Reserved for concepts with insufficient information to code with codable children: Secondary | ICD-10-CM

## 2019-10-21 DIAGNOSIS — E1165 Type 2 diabetes mellitus with hyperglycemia: Secondary | ICD-10-CM

## 2019-10-21 DIAGNOSIS — E118 Type 2 diabetes mellitus with unspecified complications: Secondary | ICD-10-CM | POA: Diagnosis not present

## 2019-10-21 LAB — POCT GLYCOSYLATED HEMOGLOBIN (HGB A1C): Hemoglobin A1C: 9 % — AB (ref 4.0–5.6)

## 2019-10-21 MED ORDER — METFORMIN HCL 500 MG PO TABS: 500.0000 mg | ORAL_TABLET | Freq: Two times a day (BID) | ORAL | 3 refills | Status: DC

## 2019-10-21 NOTE — Patient Instructions (Signed)

## 2019-10-21 NOTE — Progress Notes (Signed)
10/21/2019   Endocrinology follow-up note   Subjective:    Patient ID: Vicki Ward, female    DOB: 12-09-1960, 59 y.o.   MRN: [id number]   Vicki Ward is a 59 y.o. female presenting on 10/21/2019 for Follow-up (DM type 2)  Diabetes She presents for her follow-up diabetic visit. She has type 2 diabetes mellitus. Onset time: She was diagnosed at approximate age of 58 years. Her disease course has been worsening. There are no hypoglycemic associated symptoms. Pertinent negatives for hypoglycemia include no headaches or pallor. Pertinent negatives for diabetes include no chest pain, no fatigue, no foot ulcerations, no polyphagia and no weight loss. There are no hypoglycemic complications. Symptoms are worsening. There are no diabetic complications. Risk factors for coronary artery disease include diabetes mellitus, dyslipidemia and hypertension. Current diabetic treatment includes insulin injections and oral agent (monotherapy). She is compliant with treatment most of the time. Her weight is increasing steadily. She is following a diabetic diet. She has had a previous visit with a dietitian. She participates in exercise intermittently. Her home blood glucose trend is decreasing steadily. Her breakfast blood glucose range is generally 140-180 mg/dl. Her lunch blood glucose range is generally 140-180 mg/dl. Her dinner blood glucose range is generally 140-180 mg/dl. Her bedtime blood glucose range is generally 140-180 mg/dl. Her overall blood glucose range is 140-180 mg/dl. (She presents her Dexcom CGM printouts for the last 4 weeks.   -Her CGM printouts reveal 65-82%,  time in range, 10-25 %, above range.  No significant hypoglycemia.  Her point-of-care A1c 9%, estimated A1c from CGM is 7.5-8%.  She has no symptoms of hyperglycemia.) An ACE inhibitor/angiotensin II receptor blocker is being taken. Eye exam is current.  Hypertension This is a chronic problem. Pertinent negatives include no chest  pain, headaches, palpitations or shortness of breath. Risk factors for coronary artery disease include dyslipidemia, diabetes mellitus and sedentary lifestyle. Past treatments include ACE inhibitors. The current treatment provides moderate improvement. There are no compliance problems.   Hyperlipidemia This is a chronic problem. The current episode started more than 1 year ago. The problem is controlled. Pertinent negatives include no chest pain or shortness of breath. Current antihyperlipidemic treatment includes statins. The current treatment provides moderate improvement of lipids. There are no compliance problems.  Risk factors for coronary artery disease include diabetes mellitus, dyslipidemia and hypertension.    Review of systems  Constitutional: + Minimally fluctuating body weight,  current  Body mass index is 27.37 kg/m. , no fatigue, no subjective hyperthermia, no subjective hypothermia Eyes: no blurry vision, no xerophthalmia ENT: no sore throat, no nodules palpated in throat, no dysphagia/odynophagia, no hoarseness Cardiovascular: no Chest Pain, no Shortness of Breath, no palpitations, no leg swelling Respiratory: no cough, no shortness of breath Gastrointestinal: no Nausea/Vomiting/Diarhhea Musculoskeletal: no muscle/joint aches Skin: no rashes, no hyperemia Neurological: no tremors, no numbness, no tingling, no dizziness Psychiatric: no depression, no anxiety     Objective:    BP 134/81   Pulse 70   Ht 5' 7.5" (1.715 m)   Wt 177 lb 6.4 oz (80.5 kg)   BMI 27.37 kg/m   Wt Readings from Last 3 Encounters:  10/21/19 177 lb 6.4 oz (80.5 kg)  06/22/19 180 lb (81.6 kg)  11/05/18 171 lb (77.6 kg)    Physical Exam  Constitutional: She is oriented to person, place, and time. She appears well-developed and well-nourished.  HENT:  Head: Normocephalic and atraumatic.  Eyes: EOM are normal.  Cardiovascular: Normal  rate and regular rhythm.  Pulmonary/Chest: Effort normal. No  respiratory distress. She has no wheezes.  Abdominal: She exhibits no distension. There is no guarding.  Musculoskeletal:        General: Normal range of motion.     Cervical back: Normal range of motion and neck supple.  Neurological: She is alert and oriented to person, place, and time. She has normal reflexes.  Skin: Skin is warm and dry. No rash noted. No erythema. No pallor.  Psychiatric: She has a normal mood and affect. Her behavior is normal. Judgment and thought content normal.    Results for orders placed or performed in visit on 10/21/19  HgB A1c  Result Value Ref Range   Hemoglobin A1C 9.0 (A) 4.0 - 5.6 %   HbA1c POC (<> result, manual entry)     HbA1c, POC (prediabetic range)     HbA1c, POC (controlled diabetic range)     Complete Blood Count (Most recent): Lab Results  Component Value Date   WBC 11.0 (H) 05/12/2008   HGB 13.1 05/12/2008   HCT 38.8 05/12/2008   MCV 94.3 05/12/2008   PLT 287 05/12/2008    Lipid Panel     Component Value Date/Time   CHOL 179 03/26/2018 1106   TRIG 63 03/26/2018 1106   HDL 71 03/26/2018 1106   CHOLHDL 2.0 04/02/2016 1037   LDLCALC 95 03/26/2018 1106    Diagnosis:  1. uncontrolled type 2 diabetes 2.  Hyperlipidemia 3. Hypertension    Assessment & Plan:   1. Uncontrolled type 2 DM: -She came with fluctuating glycemic profile, significant for an predictable insulin sensitivity.    She presents her Dexcom CGM printouts for the last 4 weeks.   -Her CGM printouts reveal 65-82%,  time in range, 10-25 %, above range.  No significant hypoglycemia.  Her point-of-care A1c 9%, estimated A1c from CGM is 7.5-8%.  She has no symptoms of hyperglycemia.  I have counseled the patient  to stay on diet management by adopting a carbohydrate restricted diet. Her body weight is appropriate at this time.   -She is advised to continue basal insulin Basaglar 44 units nightly, continue Humalog  8-11  units 3 times daily for meals for pre-meal  blood glucose readings above 70 mg/dL.   -She is asked to document  blood glucose 4 times a day-before meals and at bedtime, using her Dexcom CGM device.  -She is advised to resume Metformin at 500 mg p.o. twice daily-daily after breakfast and after supper supper.    Her anti-GAD antibodies < 5 .   -Target numbers for a1c, LDL, HDL, Triglycerides,  were discussed in detail.   2. HTN: Her blood pressure is controlled better after her enalapril was adjusted to 10 mg p.o. daily at breakfast, she is advised to continue salt restrictions.    3. Hyperlipidemia: Her recent lipid panel showed uncontrolled LDL at 95.   She is advised to continue simvastatin 20 mg p.o. nightly.    She will be considered for fasting lipid panel on subsequent visits.   4. Chronic care management: -Patient is on ACEI and Statin medications and encouraged to continue to follow up with Ophthalmology at least yearly or according to recommendations, and advised to stay away from smoking.  -Patient is advised to continue f/u with her PMD for primary care needs.  - Time spent on this patient care encounter:  35 min, of which > 50% was spent in  counseling and the rest reviewing her  blood glucose logs , discussing her hypoglycemia and hyperglycemia episodes, reviewing her current and  previous labs / studies  ( including abstraction from other facilities) and medications  doses and developing a  long term treatment plan and documenting her care.   Please refer to Patient Instructions for Blood Glucose Monitoring and Insulin/Medications Dosing Guide"  in media tab for additional information. Please  also refer to " Patient Self Inventory" in the Media  tab for reviewed elements of pertinent patient history.  [name] participated in the discussions, expressed understanding, and voiced agreement with the above plans.  All questions were answered to her satisfaction. she is encouraged to contact clinic should she have any  questions or concerns prior to her return visit.   Follow up plan: Return in about 4 months (around 02/21/2020) for Bring Meter and Logs- A1c in Office.   Glade Lloyd, MD [facility]  This note was partially dictated with voice recognition software. Similar sounding words can be transcribed inadequately or may not  be corrected upon review.

## 2019-11-01 ENCOUNTER — Other Ambulatory Visit: Payer: Self-pay | Admitting: "Endocrinology

## 2019-11-29 ENCOUNTER — Other Ambulatory Visit: Payer: Self-pay | Admitting: "Endocrinology

## 2019-12-21 ENCOUNTER — Other Ambulatory Visit: Payer: Self-pay | Admitting: "Endocrinology

## 2020-01-20 ENCOUNTER — Other Ambulatory Visit: Payer: Self-pay | Admitting: "Endocrinology

## 2020-02-17 ENCOUNTER — Other Ambulatory Visit: Payer: Self-pay | Admitting: "Endocrinology

## 2020-02-22 ENCOUNTER — Other Ambulatory Visit: Payer: Self-pay | Admitting: "Endocrinology

## 2020-02-24 ENCOUNTER — Other Ambulatory Visit: Payer: Self-pay

## 2020-02-24 ENCOUNTER — Encounter: Payer: Self-pay | Admitting: Nurse Practitioner

## 2020-02-24 ENCOUNTER — Ambulatory Visit: Payer: 59 | Admitting: Nurse Practitioner

## 2020-02-24 VITALS — BP 117/74 | HR 75 | Ht 67.5 in | Wt 175.4 lb

## 2020-02-24 DIAGNOSIS — IMO0002 Reserved for concepts with insufficient information to code with codable children: Secondary | ICD-10-CM

## 2020-02-24 DIAGNOSIS — I10 Essential (primary) hypertension: Secondary | ICD-10-CM | POA: Diagnosis not present

## 2020-02-24 DIAGNOSIS — E118 Type 2 diabetes mellitus with unspecified complications: Secondary | ICD-10-CM | POA: Diagnosis not present

## 2020-02-24 DIAGNOSIS — E1165 Type 2 diabetes mellitus with hyperglycemia: Secondary | ICD-10-CM | POA: Diagnosis not present

## 2020-02-24 DIAGNOSIS — Z794 Long term (current) use of insulin: Secondary | ICD-10-CM

## 2020-02-24 DIAGNOSIS — E782 Mixed hyperlipidemia: Secondary | ICD-10-CM

## 2020-02-24 LAB — POCT GLYCOSYLATED HEMOGLOBIN (HGB A1C): Hemoglobin A1C: 8.9 % — AB (ref 4.0–5.6)

## 2020-02-24 MED ORDER — METFORMIN HCL ER 500 MG PO TB24: 500.0000 mg | ORAL_TABLET | Freq: Every day | ORAL | 3 refills | Status: DC

## 2020-02-24 MED ORDER — GLIPIZIDE ER 5 MG PO TB24: 5.0000 mg | ORAL_TABLET | Freq: Every day | ORAL | 3 refills | Status: DC

## 2020-02-24 NOTE — Progress Notes (Signed)
02/24/2020   Endocrinology follow-up note   Subjective:    Patient ID: Vicki Ward, female    DOB: 22-Jan-1961, 59 y.o.   MRN: [id number]   Vicki Ward is a 59 y.o. female presenting on 02/24/2020 for follow up of currently uncontrolled diabetes, hypertension, and hyperlipidemia.  Diabetes She presents for her follow-up diabetic visit. She has type 2 diabetes mellitus. Onset time: She was diagnosed at approximate age of 81 years. Her disease course has been improving. There are no hypoglycemic associated symptoms. Pertinent negatives for hypoglycemia include no headaches or pallor. Pertinent negatives for diabetes include no chest pain, no fatigue, no foot ulcerations, no polyphagia and no weight loss. There are no hypoglycemic complications. Symptoms are improving. There are no diabetic complications. Risk factors for coronary artery disease include diabetes mellitus, dyslipidemia, hypertension and sedentary lifestyle. Current diabetic treatment includes insulin injections and oral agent (monotherapy). She is compliant with treatment most of the time. Her weight is fluctuating minimally. She is following a generally unhealthy diet. When asked about meal planning, she reported none. She has had a previous visit with a dietitian. She participates in exercise intermittently. Her home blood glucose trend is fluctuating minimally. (She presents today with her CGM showing significantly above target glycemic profile overall.  She is a second shift worker at the jail.  Her POCT A1C today is 8.9%, essentially unchanged from last visit of 9%.  She admits she isnt taking her Metformin like she should due to GI side effects.  There are no major episodes of hypoglycemia noted.) An ACE inhibitor/angiotensin II receptor blocker is being taken. She does not see a podiatrist.Eye exam is current.  Hypertension This is a chronic problem. The current episode started more than 1 year ago. The problem has been  gradually improving since onset. The problem is controlled. Pertinent negatives include no chest pain, headaches, palpitations or shortness of breath. There are no associated agents to hypertension. Risk factors for coronary artery disease include dyslipidemia, diabetes mellitus and sedentary lifestyle. Past treatments include ACE inhibitors. The current treatment provides moderate improvement. There are no compliance problems.   Hyperlipidemia This is a chronic problem. The current episode started more than 1 year ago. The problem is controlled. Recent lipid tests were reviewed and are high. Exacerbating diseases include diabetes. Factors aggravating her hyperlipidemia include fatty foods. Pertinent negatives include no chest pain or shortness of breath. Current antihyperlipidemic treatment includes statins. The current treatment provides moderate improvement of lipids. Compliance problems include adherence to diet and adherence to exercise.  Risk factors for coronary artery disease include diabetes mellitus, dyslipidemia, hypertension and a sedentary lifestyle.    Review of systems  Constitutional: + Minimally fluctuating body weight,  current  Body mass index is 27.07 kg/m. , no fatigue, no subjective hyperthermia, no subjective hypothermia Eyes: no blurry vision, no xerophthalmia ENT: no sore throat, no nodules palpated in throat, no dysphagia/odynophagia, no hoarseness Cardiovascular: no Chest Pain, no Shortness of Breath, no palpitations, no leg swelling Respiratory: no cough, no shortness of breath Gastrointestinal: no Nausea/Vomiting/Diarrhea Musculoskeletal: no muscle/joint aches Skin: no rashes, no hyperemia Neurological: no tremors, no numbness, no tingling, no dizziness Psychiatric: no depression, no anxiety     Objective:    BP 117/74 (BP Location: Left Arm, Patient Position: Sitting)   Pulse 75   Ht 5' 7.5" (1.715 m)   Wt 175 lb 6.4 oz (79.6 kg)   BMI 27.07 kg/m   Wt  Readings from Last 3 Encounters:  02/24/20 175 lb 6.4 oz (79.6 kg)  10/21/19 177 lb 6.4 oz (80.5 kg)  06/22/19 180 lb (81.6 kg)    BP Readings from Last 3 Encounters:  02/24/20 117/74  10/21/19 134/81  06/22/19 (!) 144/89   Physical Exam- Limited  Constitutional:  Body mass index is 27.07 kg/m. , not in acute distress, normal state of mind Eyes:  EOMI, no exophthalmos Neck: Supple Thyroid: No gross goiter Cardiovascular: RRR, no murmers, rubs, or gallops, no edema Respiratory: Adequate breathing efforts, no crackles, rales, rhonchi, or wheezing Musculoskeletal: no gross deformities, strength intact in all four extremities, no gross restriction of joint movements Skin:  no rashes, no hyperemia Neurological: no tremor with outstretched hands  Foot exam:   No rashes, ulcers, cuts, calluses, onychodystrophy.   Good pulses bilat.  Good sensation to 10 g monofilament bilat.  Ingrown toenails to bilateral great toes  Hammer toe deformity bilaterally   Results for orders placed or performed in visit on 02/24/20  HgB A1c  Result Value Ref Range   Hemoglobin A1C 8.9 (A) 4.0 - 5.6 %   HbA1c POC (<> result, manual entry)     HbA1c, POC (prediabetic range)     HbA1c, POC (controlled diabetic range)     Complete Blood Count (Most recent): Lab Results  Component Value Date   WBC 11.0 (H) 05/12/2008   HGB 13.1 05/12/2008   HCT 38.8 05/12/2008   MCV 94.3 05/12/2008   PLT 287 05/12/2008    Lipid Panel     Component Value Date/Time   CHOL 179 03/26/2018 1106   TRIG 63 03/26/2018 1106   HDL 71 03/26/2018 1106   CHOLHDL 2.0 04/02/2016 1037   LDLCALC 95 03/26/2018 1106       Assessment & Plan:   1. Uncontrolled type 2 DM:  She presents today with her CGM showing significantly above target glycemic profile overall.  She is a second shift worker at the jail.  Her POCT A1C today is 8.9%, essentially unchanged from last visit of 9%.  She admits she isnt taking her Metformin  like she should due to GI side effects.  There are no major episodes of hypoglycemia noted.  Her TIR is 57% (her range has been set 85-235).  - The patient admits there is a room for improvement in their diet and drink choices. -  Suggestion is made for the patient to avoid simple carbohydrates from their diet including Cakes, Sweet Desserts / Pastries, Ice Cream, Soda (diet and regular), Sweet Tea, Candies, Chips, Cookies, Sweet Pastries,  Store Bought Juices, Alcohol in Excess of  1-2 drinks a day, Artificial Sweeteners, Coffee Creamer, and "Sugar-free" Products. This will help patient to have stable blood glucose profile and potentially avoid unintended weight gain.   - I encouraged the patient to switch to  unprocessed or minimally processed complex starch and increased protein intake (animal or plant source), fruits, and vegetables.   - Patient is advised to stick to a routine mealtimes to eat 3 meals  a day and avoid unnecessary snacks ( to snack only to correct hypoglycemia).  -She is advised to continue basal insulin Basaglar 44 units nightly, continue Humalog  8-11  units 3 times daily for meals for pre-meal blood glucose readings above 90 and she is eating. -Given her current GI side effects, will change Metformin to Meformin 500 mg XL daily with breakfast. -I discussed and initiated Glipizide 5 mg XL daily with breakfast.  -She is encouraged to monitor blood  glucose with her CGM at least 4 times per day, before meals and at bedtime and report to the clinic if blood glucose readings are less than 70 or greater than 300 for 3 tests in a row.  -Target numbers for a1c, LDL, HDL, Triglycerides,  were discussed in detail.   2. HTN:  Her blood pressure is controlled to target.  She is advised to continue Enalapril 10 mg po daily.  3. Hyperlipidemia:  Her most recent lipid panel from 03/26/2018 shows uncontrolled LDL of 95.  She is advised to continue Simvastatin 20 mg po daily at bedtime.   Side effects and precautions discussed with her. Will recheck lipid panel prior to next visit.  4. Chronic care management: -Patient is on ACEI and Statin medications and encouraged to continue to follow up with Ophthalmology and podiatry at least yearly or according to recommendations, and advised to stay away from smoking.  -Patient is advised to continue f/u with her PMD for primary care needs.  - Time spent on this patient care encounter:  35 min, of which > 50% was spent in  counseling and the rest reviewing her blood glucose logs , discussing her hypoglycemia and hyperglycemia episodes, reviewing her current and  previous labs / studies  ( including abstraction from other facilities) and medications  doses and developing a  long term treatment plan and documenting her care.   Please refer to Patient Instructions for Blood Glucose Monitoring and Insulin/Medications Dosing Guide"  in media tab for additional information. Please  also refer to " Patient Self Inventory" in the Media  tab for reviewed elements of pertinent patient history.  [name] participated in the discussions, expressed understanding, and voiced agreement with the above plans.  All questions were answered to her satisfaction. she is encouraged to contact clinic should she have any questions or concerns prior to her return visit.   Follow up plan: Return in about 4 months (around 06/25/2020) for Diabetes follow up, Previsit labs, Virtual visit ok.   Rayetta Pigg, FNP-BC [facility] Phone: [phone] Fax: [phone]  This note was partially dictated with voice recognition software. Similar sounding words can be transcribed inadequately or may not  be corrected upon review.

## 2020-02-24 NOTE — Patient Instructions (Signed)

## 2020-03-23 ENCOUNTER — Other Ambulatory Visit: Payer: Self-pay | Admitting: "Endocrinology

## 2020-06-18 ENCOUNTER — Other Ambulatory Visit: Payer: Self-pay | Admitting: "Endocrinology

## 2020-06-19 ENCOUNTER — Other Ambulatory Visit: Payer: Self-pay | Admitting: "Endocrinology

## 2020-06-23 LAB — COMPREHENSIVE METABOLIC PANEL
ALT: 24 IU/L (ref 0–32)
AST: 20 IU/L (ref 0–40)
Albumin/Globulin Ratio: 1.8 (ref 1.2–2.2)
Albumin: 4.7 g/dL (ref 3.8–4.9)
Alkaline Phosphatase: 103 IU/L (ref 44–121)
BUN/Creatinine Ratio: 19 (ref 9–23)
BUN: 13 mg/dL (ref 6–24)
Bilirubin Total: 0.3 mg/dL (ref 0.0–1.2)
CO2: 27 mmol/L (ref 20–29)
Calcium: 9.7 mg/dL (ref 8.7–10.2)
Chloride: 103 mmol/L (ref 96–106)
Creatinine, Ser: 0.7 mg/dL (ref 0.57–1.00)
GFR calc Af Amer: 110 mL/min/{1.73_m2} (ref >59)
GFR calc non Af Amer: 96 mL/min/{1.73_m2} (ref >59)
Globulin, Total: 2.6 g/dL (ref 1.5–4.5)
Glucose: 144 mg/dL — ABNORMAL HIGH (ref 65–99)
Potassium: 4 mmol/L (ref 3.5–5.2)
Sodium: 142 mmol/L (ref 134–144)
Total Protein: 7.3 g/dL (ref 6.0–8.5)

## 2020-06-23 LAB — LIPID PANEL
Chol/HDL Ratio: 2.8 ratio (ref 0.0–4.4)
Cholesterol, Total: 224 mg/dL — ABNORMAL HIGH (ref 100–199)
HDL: 81 mg/dL (ref >39)
LDL Chol Calc (NIH): 137 mg/dL — ABNORMAL HIGH (ref 0–99)
Triglycerides: 37 mg/dL (ref 0–149)
VLDL Cholesterol Cal: 6 mg/dL (ref 5–40)

## 2020-06-23 LAB — HEMOGLOBIN A1C
Est. average glucose Bld gHb Est-mCnc: 246 mg/dL
Hgb A1c MFr Bld: 10.2 % — ABNORMAL HIGH (ref 4.8–5.6)

## 2020-06-29 ENCOUNTER — Encounter: Payer: Self-pay | Admitting: Nurse Practitioner

## 2020-06-29 ENCOUNTER — Telehealth (INDEPENDENT_AMBULATORY_CARE_PROVIDER_SITE_OTHER): Payer: 59 | Admitting: Nurse Practitioner

## 2020-06-29 DIAGNOSIS — E782 Mixed hyperlipidemia: Secondary | ICD-10-CM

## 2020-06-29 DIAGNOSIS — I10 Essential (primary) hypertension: Secondary | ICD-10-CM

## 2020-06-29 DIAGNOSIS — Z794 Long term (current) use of insulin: Secondary | ICD-10-CM | POA: Diagnosis not present

## 2020-06-29 DIAGNOSIS — E1165 Type 2 diabetes mellitus with hyperglycemia: Secondary | ICD-10-CM | POA: Diagnosis not present

## 2020-06-29 DIAGNOSIS — IMO0002 Reserved for concepts with insufficient information to code with codable children: Secondary | ICD-10-CM

## 2020-06-29 DIAGNOSIS — E118 Type 2 diabetes mellitus with unspecified complications: Secondary | ICD-10-CM

## 2020-06-29 MED ORDER — GLIPIZIDE ER 5 MG PO TB24: 5.0000 mg | ORAL_TABLET | Freq: Every day | ORAL | 3 refills | Status: DC

## 2020-06-29 MED ORDER — SIMVASTATIN 40 MG PO TABS: 40.0000 mg | ORAL_TABLET | Freq: Every day | ORAL | 3 refills | Status: DC

## 2020-06-29 NOTE — Progress Notes (Signed)
06/29/2020   Endocrinology follow-up note   TELEHEALTH VISIT: The patient is being engaged in telehealth visit due to COVID-19.  This type of visit limits physical examination significantly, and thus is not preferable over face-to-face encounters.  I connected with  Vicki Ward on 06/29/20 by a video enabled telemedicine application and verified that I am speaking with the correct person using two identifiers.   I discussed the limitations of evaluation and management by telemedicine. The patient expressed understanding and agreed to proceed.    The participants involved in this visit include: Brita Romp, NP located at [facility] and Vicki Ward  located at their personal residence listed.   Subjective:    Patient ID: Vicki Ward, female    DOB: 06-11-1960, 60 y.o.   MRN: [id number]   Vicki Ward is a 60 y.o. female presenting on 06/29/2020 for follow up of currently uncontrolled diabetes, hypertension, and hyperlipidemia.  Past Medical History:  Diagnosis Date  . Diabetes ([place])   . Hypercholesteremia    Family History  Problem Relation Age of Onset  . Rectal cancer Sister        age at onset 59, recently diagnosed   . Diabetes type I Mother   . Diabetes Mother   . Hypertension Father   . Diabetes Sister   . Diabetes Brother     Past Surgical History:  Procedure Laterality Date  . APPENDECTOMY    . COLONOSCOPY N/A 10/01/2012   Procedure: COLONOSCOPY;  Surgeon: Daneil Dolin, MD;  Location: [facility];  Service: Endoscopy;  Laterality: N/A;  11:00  . DILATION AND CURETTAGE OF UTERUS      Current Outpatient Medications:  .  aspirin 81 MG tablet, Take 81 mg by mouth daily., Disp: , Rfl:  .  BD PEN NEEDLE NANO U/F 32G X 4 MM MISC, USE 1 NEEDLE WITH INSULIN PEN 4 TIMES A DAY, Disp: 100 each, Rfl: 2 .  Continuous Blood Gluc Sensor (DEXCOM G6 SENSOR) MISC, USE AS DIRECTED, Disp: 3 each, Rfl: 3 .  Continuous Blood Gluc  Transmit (DEXCOM G6 TRANSMITTER) MISC, 1 each by Other route See admin instructions. Use as directed to check blood glucose four times daily, Disp: 1 each, Rfl: 1 .  enalapril (VASOTEC) 10 MG tablet, TAKE 1 TABLET BY MOUTH EVERY DAY, Disp: 30 tablet, Rfl: 5 .  HUMALOG KWIKPEN 100 UNIT/ML KwikPen, INJECT 8-11 UNITS TOTAL INTO THE SKIN 3 (THREE) TIMES DAILY., Disp: 15 mL, Rfl: 1 .  Insulin Glargine (LANTUS SOLOSTAR) 100 UNIT/ML Solostar Pen, Inject 44 Units into the skin at bedtime., Disp: 15 mL, Rfl: 2 .  metFORMIN (GLUCOPHAGE-XR) 500 MG 24 hr tablet, Take 1 tablet (500 mg total) by mouth daily with breakfast., Disp: 90 tablet, Rfl: 3 .  Multiple Vitamin (MULTIVITAMIN) capsule, Take 1 capsule by mouth daily., Disp: , Rfl:  .  ONETOUCH VERIO test strip, USE TO TEST BLOOD SUGAR 4 TIMES A DAY, Disp: 100 strip, Rfl: 8 .  glipiZIDE (GLUCOTROL XL) 5 MG 24 hr tablet, Take 1 tablet (5 mg total) by mouth daily with breakfast., Disp: 90 tablet, Rfl: 3 .  simvastatin (ZOCOR) 40 MG tablet, Take 1 tablet (40 mg total) by mouth at bedtime., Disp: 90 tablet, Rfl: 3  Diabetes She presents for her follow-up diabetic visit. She has type 2 diabetes mellitus. Onset time: She was diagnosed at approximate age of 67 years. Her disease course has been worsening. Hypoglycemia symptoms include nervousness/anxiousness, sweats  and tremors. Pertinent negatives for hypoglycemia include no headaches or pallor. Associated symptoms include fatigue. Pertinent negatives for diabetes include no chest pain, no foot ulcerations, no polyphagia and no weight loss. There are no hypoglycemic complications. Symptoms are improving. There are no diabetic complications. Risk factors for coronary artery disease include diabetes mellitus, dyslipidemia, hypertension and sedentary lifestyle. Current diabetic treatment includes oral agent (dual therapy) and intensive insulin program. She is compliant with treatment most of the time. Her weight is  fluctuating minimally. She is following a generally unhealthy diet. When asked about meal planning, she reported none. She has had a previous visit with a dietitian. She participates in exercise intermittently. Her home blood glucose trend is fluctuating dramatically. Her overall blood glucose range is >200 mg/dl. (She presents for her virtual visit with her CGM ready to review.  Her previsit A1c was 10.2%, increasing from last visit of 8.9%.  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  She does have fluctuations in glucose noted, likely associated to missed meals.  She has not been taking her Glipizide as recommended on last visit (says it was never prescribed for her).) An ACE inhibitor/angiotensin II receptor blocker is being taken. She does not see a podiatrist.Eye exam is current.  Hypertension This is a chronic problem. The current episode started more than 1 year ago. The problem has been gradually improving since onset. The problem is controlled. Associated symptoms include sweats. Pertinent negatives include no chest pain, headaches, palpitations or shortness of breath. There are no associated agents to hypertension. Risk factors for coronary artery disease include dyslipidemia, diabetes mellitus and sedentary lifestyle. Past treatments include ACE inhibitors. The current treatment provides moderate improvement. There are no compliance problems.   Hyperlipidemia This is a chronic problem. The current episode started more than 1 year ago. The problem is controlled. Recent lipid tests were reviewed and are high. Exacerbating diseases include diabetes. Factors aggravating her hyperlipidemia include fatty foods. Pertinent negatives include no chest pain or shortness of breath. Current antihyperlipidemic treatment includes statins. The current treatment provides moderate improvement of lipids. Compliance problems include  adherence to diet and adherence to exercise.  Risk factors for coronary artery disease include diabetes mellitus, dyslipidemia, hypertension and a sedentary lifestyle.    Review of systems  Constitutional: + Minimally fluctuating body weight,  current  There is no height or weight on file to calculate BMI. , + fatigue, no subjective hyperthermia, no subjective hypothermia Eyes: no blurry vision, no xerophthalmia ENT: no sore throat, no nodules palpated in throat, no dysphagia/odynophagia, no hoarseness Cardiovascular: no Chest Pain, no Shortness of Breath, no palpitations, no leg swelling Respiratory: no cough, no shortness of breath Gastrointestinal: no Nausea/Vomiting/Diarrhea Musculoskeletal: no muscle/joint aches Skin: no rashes, no hyperemia Neurological: no tremors, no numbness, no tingling, no dizziness Psychiatric: no depression, no anxiety     Objective:    There were no vitals taken for this visit.  Wt Readings from Last 3 Encounters:  02/24/20 175 lb 6.4 oz (79.6 kg)  10/21/19 177 lb 6.4 oz (80.5 kg)  06/22/19 180 lb (81.6 kg)    BP Readings from Last 3 Encounters:  02/24/20 117/74  10/21/19 134/81  06/22/19 (!) 144/89   Physical Exam- Telehealth- significantly limited due to nature of visit  Constitutional: There is no height or weight on file to calculate BMI. , not in acute distress, normal state of mind Respiratory: Adequate breathing efforts  Results for orders placed or performed in visit on 02/24/20  Comprehensive metabolic panel  Result Value Ref Range   Glucose 144 (H) 65 - 99 mg/dL   BUN 13 6 - 24 mg/dL   Creatinine, Ser 0.70 0.57 - 1.00 mg/dL   GFR calc non Af Amer 96 >59 mL/min/1.73   GFR calc Af Amer 110 >59 mL/min/1.73   BUN/Creatinine Ratio 19 9 - 23   Sodium 142 134 - 144 mmol/L   Potassium 4.0 3.5 - 5.2 mmol/L   Chloride 103 96 - 106 mmol/L   CO2 27 20 - 29 mmol/L   Calcium 9.7 8.7 - 10.2 mg/dL   Total Protein 7.3 6.0 - 8.5 g/dL    Albumin 4.7 3.8 - 4.9 g/dL   Globulin, Total 2.6 1.5 - 4.5 g/dL   Albumin/Globulin Ratio 1.8 1.2 - 2.2   Bilirubin Total 0.3 0.0 - 1.2 mg/dL   Alkaline Phosphatase 103 44 - 121 IU/L   AST 20 0 - 40 IU/L   ALT 24 0 - 32 IU/L  Lipid panel  Result Value Ref Range   Cholesterol, Total 224 (H) 100 - 199 mg/dL   Triglycerides 37 0 - 149 mg/dL   HDL 81 >39 mg/dL   VLDL Cholesterol Cal 6 5 - 40 mg/dL   LDL Chol Calc (NIH) 137 (H) 0 - 99 mg/dL   Chol/HDL Ratio 2.8 0.0 - 4.4 ratio  Hemoglobin A1c  Result Value Ref Range   Hgb A1c MFr Bld 10.2 (H) 4.8 - 5.6 %   Est. average glucose Bld gHb Est-mCnc 246 mg/dL  HgB A1c  Result Value Ref Range   Hemoglobin A1C 8.9 (A) 4.0 - 5.6 %   HbA1c POC (<> result, manual entry)     HbA1c, POC (prediabetic range)     HbA1c, POC (controlled diabetic range)     Complete Blood Count (Most recent): Lab Results  Component Value Date   WBC 11.0 (H) 05/12/2008   HGB 13.1 05/12/2008   HCT 38.8 05/12/2008   MCV 94.3 05/12/2008   PLT 287 05/12/2008    Lipid Panel     Component Value Date/Time   CHOL 224 (H) 06/22/2020 0944   TRIG 37 06/22/2020 0944   HDL 81 06/22/2020 0944   CHOLHDL 2.8 06/22/2020 0944   LDLCALC 137 (H) 06/22/2020 0944       Assessment & Plan:   1. Uncontrolled type 2 DM with long-term insulin use:  She presents for her virtual visit with her CGM ready to review.  Her previsit A1c was 10.2%, increasing from last visit of 8.9%.  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  She does have fluctuations in glucose noted, likely associated to missed meals.  She has not been taking her Glipizide as recommended on last visit (says it was never prescribed for her).  - The patient admits there is a room for improvement in their diet and drink choices. -  Suggestion is made for the patient to avoid simple carbohydrates from their diet including Cakes, Sweet  Desserts / Pastries, Ice Cream, Soda (diet and regular), Sweet Tea, Candies, Chips, Cookies, Sweet Pastries,  Store Bought Juices, Alcohol in Excess of  1-2 drinks a day, Artificial Sweeteners, Coffee Creamer, and "Sugar-free" Products. This will help patient to have stable blood glucose profile and potentially avoid unintended weight gain.   - I encouraged the patient to switch to  unprocessed or minimally processed complex starch and increased protein intake (animal or plant source), fruits, and vegetables.   - Patient is advised to stick to a routine mealtimes to eat 3 meals  a day and avoid unnecessary snacks ( to snack only to correct hypoglycemia).  -She is advised to continue basal insulin Basaglar 44 units nightly, continue Humalog  8-11  units 3 times daily for meals for pre-meal blood glucose readings above 90 and she is eating.  She has tolerated Metformin well and is advised to continue 500 mg ER daily with breakfast.  -I discussed and reinitiated Glipizide 5 mg XL daily with breakfast (did not take since recommended at last visit- says it was never sent to pharmacy).  -She is encouraged to monitor blood glucose with her CGM at least 4 times per day, before meals and at bedtime and report to the clinic if blood glucose readings are less than 70 or greater than 300 for 3 tests in a row.  -Target numbers for a1c, LDL, HDL, Triglycerides,  were discussed in detail.   2. HTN:  Her blood pressure is controlled to target.  She is advised to continue Enalapril 10 mg po daily.  3. Hyperlipidemia:  Her most recent lipid panel from 06/22/20 shows uncontrolled LDL of 137 (worsening).  She is advised to increase her Simvastatin to 40 mg po daily at bedtime.  Side effects and precautions discussed with her.   4. Chronic care management: -Patient is on ACEI and Statin medications and encouraged to continue to follow up with Ophthalmology and podiatry at least yearly or according to recommendations,  and advised to stay away from smoking.  -Patient is advised to continue f/u with her PMD for primary care needs.   I spent 30 minutes dedicated to the care of this patient on the date of this encounter to include pre-visit review of records, face-to-face time with the patient, and post visit ordering of  testing.   Please refer to Patient Instructions for Blood Glucose Monitoring and Insulin/Medications Dosing Guide"  in media tab for additional information. Please  also refer to " Patient Self Inventory" in the Media  tab for reviewed elements of pertinent patient history.  Vicki Ward participated in the discussions, expressed understanding, and voiced agreement with the above plans.  All questions were answered to her satisfaction. she is encouraged to contact clinic should she have any questions or concerns prior to her return visit.   Follow up plan: Return in about 3 months (around 09/27/2020) for Diabetes follow up with A1c in office, No previsit labs, Bring glucometer and logs, ABI next visit.   Rayetta Pigg, [facility] [address] [place], [place] [address] Phone: [phone] Fax: [phone]

## 2020-06-29 NOTE — Patient Instructions (Signed)
Diabetes Mellitus and Nutrition, Adult When you have diabetes, or diabetes mellitus, it is very important to have healthy eating habits because your blood sugar (glucose) levels are greatly affected by what you eat and drink. Eating healthy foods in the right amounts, at about the same times every day, can help you:  Control your blood glucose.  Lower your risk of heart disease.  Improve your blood pressure.  Reach or maintain a healthy weight. What can affect my meal plan? Every person with diabetes is different, and each person has different needs for a meal plan. Your health care provider may recommend that you work with a dietitian to make a meal plan that is best for you. Your meal plan may vary depending on factors such as:  The calories you need.  The medicines you take.  Your weight.  Your blood glucose, blood pressure, and cholesterol levels.  Your activity level.  Other health conditions you have, such as heart or kidney disease. How do carbohydrates affect me? Carbohydrates, also called carbs, affect your blood glucose level more than any other type of food. Eating carbs naturally raises the amount of glucose in your blood. Carb counting is a method for keeping track of how many carbs you eat. Counting carbs is important to keep your blood glucose at a healthy level, especially if you use insulin or take certain oral diabetes medicines. It is important to know how many carbs you can safely have in each meal. This is different for every person. Your dietitian can help you calculate how many carbs you should have at each meal and for each snack. How does alcohol affect me? Alcohol can cause a sudden decrease in blood glucose (hypoglycemia), especially if you use insulin or take certain oral diabetes medicines. Hypoglycemia can be a life-threatening condition. Symptoms of hypoglycemia, such as sleepiness, dizziness, and confusion, are similar to symptoms of having too much  alcohol.  Do not drink alcohol if: ? Your health care provider tells you not to drink. ? You are pregnant, may be pregnant, or are planning to become pregnant.  If you drink alcohol: ? Do not drink on an empty stomach. ? Limit how much you use to:  0-1 drink a day for women.  0-2 drinks a day for men. ? Be aware of how much alcohol is in your drink. In the [place] one drink equals one 12 oz bottle of beer (355 mL), one 5 oz glass of wine (148 mL), or one 1 oz glass of hard liquor (44 mL). ? Keep yourself hydrated with water, diet soda, or unsweetened iced tea.  Keep in mind that regular soda, juice, and other mixers may contain a lot of sugar and must be counted as carbs. What are tips for following this plan? Reading food labels  Start by checking the serving size on the "Nutrition Facts" label of packaged foods and drinks. The amount of calories, carbs, fats, and other nutrients listed on the label is based on one serving of the item. Many items contain more than one serving per package.  Check the total grams (g) of carbs in one serving. You can calculate the number of servings of carbs in one serving by dividing the total carbs by 15. For example, if a food has 30 g of total carbs per serving, it would be equal to 2 servings of carbs.  Check the number of grams (g) of saturated fats and trans fats in one serving. Choose foods that have   a low amount or none of these fats.  Check the number of milligrams (mg) of salt (sodium) in one serving. Most people should limit total sodium intake to less than 2,300 mg per day.  Always check the nutrition information of foods labeled as "low-fat" or "nonfat." These foods may be higher in added sugar or refined carbs and should be avoided.  Talk to your dietitian to identify your daily goals for nutrients listed on the label. Shopping  Avoid buying canned, pre-made, or processed foods. These foods tend to be high in fat, sodium, and added  sugar.  Shop around the outside edge of the grocery store. This is where you will most often find fresh fruits and vegetables, bulk grains, fresh meats, and fresh dairy. Cooking  Use low-heat cooking methods, such as baking, instead of high-heat cooking methods like deep frying.  Cook using healthy oils, such as olive, canola, or sunflower oil.  Avoid cooking with butter, cream, or high-fat meats. Meal planning  Eat meals and snacks regularly, preferably at the same times every day. Avoid going long periods of time without eating.  Eat foods that are high in fiber, such as fresh fruits, vegetables, beans, and whole grains. Talk with your dietitian about how many servings of carbs you can eat at each meal.  Eat 4-6 oz (112-168 g) of lean protein each day, such as lean meat, chicken, fish, eggs, or tofu. One ounce (oz) of lean protein is equal to: ? 1 oz (28 g) of meat, chicken, or fish. ? 1 egg. ?  cup (62 g) of tofu.  Eat some foods each day that contain healthy fats, such as avocado, nuts, seeds, and fish.   What foods should I eat? Fruits Berries. Apples. Oranges. Peaches. Apricots. Plums. Grapes. Mango. Papaya. Pomegranate. Kiwi. Cherries. Vegetables Lettuce. Spinach. Leafy greens, including kale, chard, collard greens, and mustard greens. Beets. Cauliflower. Cabbage. Broccoli. Carrots. Green beans. Tomatoes. Peppers. Onions. Cucumbers. Brussels sprouts. Grains Whole grains, such as whole-wheat or whole-grain bread, crackers, tortillas, cereal, and pasta. Unsweetened oatmeal. Quinoa. Brown or wild rice. Meats and other proteins Seafood. Poultry without skin. Lean cuts of poultry and beef. Tofu. Nuts. Seeds. Dairy Low-fat or fat-free dairy products such as milk, yogurt, and cheese. The items listed above may not be a complete list of foods and beverages you can eat. Contact a dietitian for more information. What foods should I avoid? Fruits Fruits canned with  syrup. Vegetables Canned vegetables. Frozen vegetables with butter or cream sauce. Grains Refined white flour and flour products such as bread, pasta, snack foods, and cereals. Avoid all processed foods. Meats and other proteins Fatty cuts of meat. Poultry with skin. Breaded or fried meats. Processed meat. Avoid saturated fats. Dairy Full-fat yogurt, cheese, or milk. Beverages Sweetened drinks, such as soda or iced tea. The items listed above may not be a complete list of foods and beverages you should avoid. Contact a dietitian for more information. Questions to ask a health care provider  Do I need to meet with a diabetes educator?  Do I need to meet with a dietitian?  What number can I call if I have questions?  When are the best times to check my blood glucose? Where to find more information:  American Diabetes Association: diabetes.org  Academy of Nutrition and Dietetics: www.eatright.org  National Institute of Diabetes and Digestive and Kidney Diseases: www.niddk.nih.gov  Association of Diabetes Care and Education Specialists: www.diabeteseducator.org Summary  It is important to have healthy eating   habits because your blood sugar (glucose) levels are greatly affected by what you eat and drink.  A healthy meal plan will help you control your blood glucose and maintain a healthy lifestyle.  Your health care provider may recommend that you work with a dietitian to make a meal plan that is best for you.  Keep in mind that carbohydrates (carbs) and alcohol have immediate effects on your blood glucose levels. It is important to count carbs and to use alcohol carefully. This information is not intended to replace advice given to you by your health care provider. Make sure you discuss any questions you have with your health care provider. Document Revised: 05/04/2019 Document Reviewed: 05/04/2019 Elsevier Patient Education  2021 Elsevier Inc.  

## 2020-09-26 NOTE — Patient Instructions (Signed)

## 2020-09-27 ENCOUNTER — Encounter: Payer: Self-pay | Admitting: Nurse Practitioner

## 2020-09-27 ENCOUNTER — Other Ambulatory Visit: Payer: Self-pay

## 2020-09-27 ENCOUNTER — Ambulatory Visit: Payer: 59 | Admitting: Nurse Practitioner

## 2020-09-27 VITALS — BP 121/70 | HR 62 | Ht 67.5 in | Wt 165.0 lb

## 2020-09-27 DIAGNOSIS — IMO0002 Reserved for concepts with insufficient information to code with codable children: Secondary | ICD-10-CM

## 2020-09-27 DIAGNOSIS — Z794 Long term (current) use of insulin: Secondary | ICD-10-CM

## 2020-09-27 DIAGNOSIS — E118 Type 2 diabetes mellitus with unspecified complications: Secondary | ICD-10-CM | POA: Diagnosis not present

## 2020-09-27 DIAGNOSIS — E1165 Type 2 diabetes mellitus with hyperglycemia: Secondary | ICD-10-CM | POA: Diagnosis not present

## 2020-09-27 DIAGNOSIS — E782 Mixed hyperlipidemia: Secondary | ICD-10-CM | POA: Diagnosis not present

## 2020-09-27 DIAGNOSIS — E559 Vitamin D deficiency, unspecified: Secondary | ICD-10-CM

## 2020-09-27 DIAGNOSIS — I10 Essential (primary) hypertension: Secondary | ICD-10-CM

## 2020-09-27 LAB — POCT GLYCOSYLATED HEMOGLOBIN (HGB A1C): HbA1c, POC (controlled diabetic range): 10.1 % — AB (ref 0.0–7.0)

## 2020-09-27 MED ORDER — METFORMIN HCL ER 500 MG PO TB24: 500.0000 mg | ORAL_TABLET | Freq: Every day | ORAL | 3 refills | Status: DC

## 2020-09-27 MED ORDER — LANTUS SOLOSTAR 100 UNIT/ML ~~LOC~~ SOPN: 50.0000 [IU] | PEN_INJECTOR | Freq: Every day | SUBCUTANEOUS | 3 refills | Status: DC

## 2020-09-27 MED ORDER — GLIPIZIDE ER 5 MG PO TB24: 5.0000 mg | ORAL_TABLET | Freq: Every day | ORAL | 3 refills | Status: DC

## 2020-09-27 MED ORDER — INSULIN LISPRO (1 UNIT DIAL) 100 UNIT/ML (KWIKPEN): 8.0000 [IU] | PEN_INJECTOR | Freq: Three times a day (TID) | SUBCUTANEOUS | 3 refills | Status: DC

## 2020-09-27 NOTE — Progress Notes (Signed)
09/27/2020   Endocrinology follow-up note      Subjective:    Patient ID: Vicki Ward, female    DOB: [birth year], 60 y.o.   MRN: [id number]   LEILI ESKENAZI is a 60 y.o. female presenting on 09/27/2020 for follow up of currently uncontrolled diabetes, hypertension, and hyperlipidemia.  Past Medical History:  Diagnosis Date  . Diabetes ([place])   . Hypercholesteremia    Family History  Problem Relation Age of Onset  . Rectal cancer Sister        age at onset 63, recently diagnosed   . Diabetes type I Mother   . Diabetes Mother   . Hypertension Father   . Diabetes Sister   . Diabetes Brother     Past Surgical History:  Procedure Laterality Date  . APPENDECTOMY    . COLONOSCOPY N/A 10/01/2012   Procedure: COLONOSCOPY;  Surgeon: Daneil Dolin, MD;  Location: [facility];  Service: Endoscopy;  Laterality: N/A;  11:00  . DILATION AND CURETTAGE OF UTERUS      Current Outpatient Medications:  .  aspirin 81 MG tablet, Take 81 mg by mouth daily., Disp: , Rfl:  .  BD PEN NEEDLE NANO U/F 32G X 4 MM MISC, USE 1 NEEDLE WITH INSULIN PEN 4 TIMES A DAY, Disp: 100 each, Rfl: 2 .  Continuous Blood Gluc Sensor (DEXCOM G6 SENSOR) MISC, USE AS DIRECTED (Patient not taking: Reported on 09/27/2020), Disp: 3 each, Rfl: 3 .  Continuous Blood Gluc Transmit (DEXCOM G6 TRANSMITTER) MISC, 1 each by Other route See admin instructions. Use as directed to check blood glucose four times daily (Patient not taking: Reported on 09/27/2020), Disp: 1 each, Rfl: 1 .  enalapril (VASOTEC) 10 MG tablet, TAKE 1 TABLET BY MOUTH EVERY DAY, Disp: 30 tablet, Rfl: 5 .  glipiZIDE (GLUCOTROL XL) 5 MG 24 hr tablet, Take 1 tablet (5 mg total) by mouth daily with breakfast., Disp: 90 tablet, Rfl: 3 .  insulin glargine (LANTUS SOLOSTAR) 100 UNIT/ML Solostar Pen, Inject 50 Units into the skin at bedtime., Disp: 45 mL, Rfl: 3 .  insulin lispro (HUMALOG KWIKPEN) 100 UNIT/ML KwikPen, Inject 8-14 Units into the skin 3 (three)  times daily., Disp: 30 mL, Rfl: 3 .  metFORMIN (GLUCOPHAGE-XR) 500 MG 24 hr tablet, Take 1 tablet (500 mg total) by mouth daily with breakfast., Disp: 90 tablet, Rfl: 3 .  Multiple Vitamin (MULTIVITAMIN) capsule, Take 1 capsule by mouth daily., Disp: , Rfl:  .  ONETOUCH VERIO test strip, USE TO TEST BLOOD SUGAR 4 TIMES A DAY, Disp: 100 strip, Rfl: 8 .  simvastatin (ZOCOR) 40 MG tablet, Take 1 tablet (40 mg total) by mouth at bedtime., Disp: 90 tablet, Rfl: 3  Diabetes She presents for her follow-up diabetic visit. She has type 2 diabetes mellitus. Onset time: She was diagnosed at approximate age of 51 years. Her disease course has been stable. There are no hypoglycemic associated symptoms. Pertinent negatives for hypoglycemia include no headaches or pallor. Associated symptoms include fatigue, polydipsia and polyuria. Pertinent negatives for diabetes include no chest pain, no foot ulcerations, no polyphagia and no weight loss. There are no hypoglycemic complications. Symptoms are stable. There are no diabetic complications. Risk factors for coronary artery disease include diabetes mellitus, dyslipidemia, hypertension and sedentary lifestyle. Current diabetic treatment includes oral agent (dual therapy) and intensive insulin program. She is compliant with treatment most of the time. Her weight is decreasing steadily. She is following a generally unhealthy diet. When  asked about meal planning, she reported none. She has had a previous visit with a dietitian. She participates in exercise intermittently. Her home blood glucose trend is fluctuating minimally. Her breakfast blood glucose range is generally 180-200 mg/dl. Her bedtime blood glucose range is generally >200 mg/dl. (She presents today with her meter, no logs, showing above target fasting glycemic profile overall.  Her POCT A1c today is 10.1%, essentially unchanged from previous visit of 10.2%.  She admits to drinking diet green tea still, but has cut  back.  She does have some episodes of hypoglycemia noted, but likely due to her low carb and protein consumption with meals and insulin intake.) An ACE inhibitor/angiotensin II receptor blocker is being taken. She does not see a podiatrist.Eye exam is current.  Hypertension This is a chronic problem. The current episode started more than 1 year ago. The problem has been gradually improving since onset. The problem is controlled. Pertinent negatives include no chest pain, headaches, palpitations or shortness of breath. There are no associated agents to hypertension. Risk factors for coronary artery disease include dyslipidemia, diabetes mellitus, sedentary lifestyle, family history and stress. Past treatments include ACE inhibitors. The current treatment provides moderate improvement. There are no compliance problems.   Hyperlipidemia This is a chronic problem. The current episode started more than 1 year ago. The problem is controlled. Recent lipid tests were reviewed and are high. Exacerbating diseases include diabetes. Factors aggravating her hyperlipidemia include fatty foods. Pertinent negatives include no chest pain or shortness of breath. Current antihyperlipidemic treatment includes statins. The current treatment provides moderate improvement of lipids. Compliance problems include adherence to diet and adherence to exercise.  Risk factors for coronary artery disease include diabetes mellitus, dyslipidemia, hypertension, a sedentary lifestyle and stress.   Review of systems  Constitutional: + Minimally fluctuating body weight,  current Body mass index is 25.46 kg/m. , no fatigue, no subjective hyperthermia, no subjective hypothermia Eyes: no blurry vision, no xerophthalmia ENT: no sore throat, no nodules palpated in throat, no dysphagia/odynophagia, no hoarseness Cardiovascular: no chest pain, no shortness of breath, no palpitations, no leg swelling Respiratory: no cough, no shortness of  breath Gastrointestinal: no nausea/vomiting/diarrhea Musculoskeletal: no muscle/joint aches Skin: no rashes, no hyperemia Neurological: no tremors, no numbness, no tingling, no dizziness Psychiatric: no depression, no anxiety     Objective:    BP 121/70 (BP Location: Right Arm)   Pulse 62   Ht 5' 7.5" (1.715 m)   Wt 165 lb (74.8 kg)   BMI 25.46 kg/m   Wt Readings from Last 3 Encounters:  09/27/20 165 lb (74.8 kg)  02/24/20 175 lb 6.4 oz (79.6 kg)  10/21/19 177 lb 6.4 oz (80.5 kg)    BP Readings from Last 3 Encounters:  09/27/20 121/70  02/24/20 117/74  10/21/19 134/81    Physical Exam- Limited  Constitutional:  Body mass index is 25.46 kg/m. , not in acute distress, normal state of mind Eyes:  EOMI, no exophthalmos Neck: Supple Cardiovascular: RRR, no murmers, rubs, or gallops, no edema Respiratory: Adequate breathing efforts, no crackles, rales, rhonchi, or wheezing Musculoskeletal: no gross deformities, strength intact in all four extremities, no gross restriction of joint movements Skin:  no rashes, no hyperemia Neurological: no tremor with outstretched hands   POCT ABI Results 09/27/20   Right ABI:  1.33      Left ABI:  1.29  Right leg systolic / diastolic: 270/35 mmHg Left leg systolic / diastolic: 009/38 mmHg  Arm systolic / diastolic:  121/70 mmHG  Detailed report will be scanned into patient chart.    Results for orders placed or performed in visit on 09/27/20  HgB A1c  Result Value Ref Range   Hemoglobin A1C     HbA1c POC (<> result, manual entry)     HbA1c, POC (prediabetic range)     HbA1c, POC (controlled diabetic range) 10.1 (A) 0.0 - 7.0 %   Complete Blood Count (Most recent): Lab Results  Component Value Date   WBC 11.0 (H) 05/12/2008   HGB 13.1 05/12/2008   HCT 38.8 05/12/2008   MCV 94.3 05/12/2008   PLT 287 05/12/2008    Lipid Panel     Component Value Date/Time   CHOL 224 (H) 06/22/2020 0944   TRIG 37 06/22/2020 0944   HDL  81 06/22/2020 0944   CHOLHDL 2.8 06/22/2020 0944   LDLCALC 137 (H) 06/22/2020 0944       Assessment & Plan:   1) Uncontrolled type 2 DM with long-term insulin use:  She presents today with her meter, no logs, showing above target fasting glycemic profile overall.  Her POCT A1c today is 10.1%, essentially unchanged from previous visit of 10.2%.  She admits to drinking diet green tea still, but has cut back.  She does have some episodes of hypoglycemia noted, but likely due to her low carb and protein consumption with meals and insulin intake.  - Nutritional counseling repeated at each appointment due to patients tendency to fall back in to old habits.  - The patient admits there is a room for improvement in their diet and drink choices. -  Suggestion is made for the patient to avoid simple carbohydrates from their diet including Cakes, Sweet Desserts / Pastries, Ice Cream, Soda (diet and regular), Sweet Tea, Candies, Chips, Cookies, Sweet Pastries,  Store Bought Juices, Alcohol in Excess of  1-2 drinks a day, Artificial Sweeteners, Coffee Creamer, and "Sugar-free" Products. This will help patient to have stable blood glucose profile and potentially avoid unintended weight gain.   - I encouraged the patient to switch to  unprocessed or minimally processed complex starch and increased protein intake (animal or plant source), fruits, and vegetables.   - Patient is advised to stick to a routine mealtimes to eat 3 meals  a day and avoid unnecessary snacks ( to snack only to correct hypoglycemia).  -Given her fasting hyperglycemia, she will tolerate increase in her Lantus to 50 units SQ nightly and will also increase her SSI Humalog to 8-14 units TID with meals if glucose is above 90 and she is eating.  Specific instructions on how to titrate insulin dose based on glucose readings given to patient in writing.  She can continue Metformin 500 mg ER daily with breakfast and Glipizide 5 mg XL daily with  breakfast.  We also discussed the importance of eating routine meals with portions of carbs and protein to avoid hypoglycemia since she is taking insulin.  -She has had trouble getting refill sensors for her Dexcom from her pharmacy.  I gave her sample today and will submit information to [employer] for fulfillment.   -She is encouraged to monitor blood glucose at least 4 times per day, before meals and at bedtime and report to the clinic if blood glucose readings are less than 70 or greater than 300 for 3 tests in a row.  -Target numbers for a1c, LDL, HDL, Triglycerides,  were discussed in detail.   2) HTN:  Her blood pressure is controlled to  target.  She is advised to continue Enalapril 10 mg po daily.  3) Hyperlipidemia:  Her most recent lipid panel from 06/22/20 shows uncontrolled LDL of 137 (worsening).  Her Simvastatin was increased to 40 mg po daily at bedtime at last visit.  Side effects and precautions discussed with her.   4) Chronic care management: -Patient is on ACEI and Statin medications and encouraged to continue to follow up with Ophthalmology and podiatry at least yearly or according to recommendations, and advised to stay away from smoking.  -Patient is advised to continue f/u with her PMD for primary care needs.     I spent 46 minutes in the care of the patient today including review of labs from [place], Lipids, Thyroid Function, Hematology (current and previous including abstractions from other facilities); face-to-face time discussing  her blood glucose readings/logs, discussing hypoglycemia and hyperglycemia episodes and symptoms, medications doses, her options of short and long term treatment based on the latest standards of care / guidelines;  discussion about incorporating lifestyle medicine;  and documenting the encounter.    Please refer to Patient Instructions for Blood Glucose Monitoring and Insulin/Medications Dosing Guide"  in media tab for additional information.  Please  also refer to " Patient Self Inventory" in the Media  tab for reviewed elements of pertinent patient history.  [name] participated in the discussions, expressed understanding, and voiced agreement with the above plans.  All questions were answered to her satisfaction. she is encouraged to contact clinic should she have any questions or concerns prior to her return visit.   Follow up plan: Return in about 3 months (around 12/27/2020) for Diabetes follow up- A1c and urine micro in office, Previsit labs, Bring glucometer and logs.   Rayetta Pigg, [facility] [address] [place], [place] [address] Phone: [phone] Fax: [phone]

## 2020-12-15 ENCOUNTER — Other Ambulatory Visit: Payer: Self-pay | Admitting: Nurse Practitioner

## 2020-12-28 ENCOUNTER — Ambulatory Visit: Payer: 59 | Admitting: Nurse Practitioner

## 2020-12-28 DIAGNOSIS — E1165 Type 2 diabetes mellitus with hyperglycemia: Secondary | ICD-10-CM

## 2020-12-28 DIAGNOSIS — I10 Essential (primary) hypertension: Secondary | ICD-10-CM

## 2020-12-28 DIAGNOSIS — E782 Mixed hyperlipidemia: Secondary | ICD-10-CM

## 2020-12-28 DIAGNOSIS — E559 Vitamin D deficiency, unspecified: Secondary | ICD-10-CM

## 2020-12-29 LAB — COMPREHENSIVE METABOLIC PANEL
ALT: 18 IU/L (ref 0–32)
AST: 15 IU/L (ref 0–40)
Albumin/Globulin Ratio: 1.8 (ref 1.2–2.2)
Albumin: 4.3 g/dL (ref 3.8–4.9)
Alkaline Phosphatase: 108 IU/L (ref 44–121)
BUN/Creatinine Ratio: 20 (ref 9–23)
BUN: 13 mg/dL (ref 6–24)
Bilirubin Total: 0.3 mg/dL (ref 0.0–1.2)
CO2: 26 mmol/L (ref 20–29)
Calcium: 9.5 mg/dL (ref 8.7–10.2)
Chloride: 103 mmol/L (ref 96–106)
Creatinine, Ser: 0.66 mg/dL (ref 0.57–1.00)
Globulin, Total: 2.4 g/dL (ref 1.5–4.5)
Glucose: 89 mg/dL (ref 65–99)
Potassium: 4.2 mmol/L (ref 3.5–5.2)
Sodium: 142 mmol/L (ref 134–144)
Total Protein: 6.7 g/dL (ref 6.0–8.5)
eGFR: 102 mL/min/{1.73_m2} (ref >59)

## 2020-12-29 LAB — T4, FREE: Free T4: 1.06 ng/dL (ref 0.82–1.77)

## 2020-12-29 LAB — TSH: TSH: 1.73 u[IU]/mL (ref 0.450–4.500)

## 2020-12-29 LAB — VITAMIN D 25 HYDROXY (VIT D DEFICIENCY, FRACTURES): Vit D, 25-Hydroxy: 43.1 ng/mL (ref 30.0–100.0)

## 2021-01-09 NOTE — Patient Instructions (Signed)

## 2021-01-10 ENCOUNTER — Other Ambulatory Visit: Payer: Self-pay

## 2021-01-10 ENCOUNTER — Ambulatory Visit: Payer: 59 | Admitting: Nurse Practitioner

## 2021-01-10 ENCOUNTER — Encounter: Payer: Self-pay | Admitting: Nurse Practitioner

## 2021-01-10 VITALS — BP 135/72 | HR 71 | Ht 67.5 in | Wt 162.2 lb

## 2021-01-10 DIAGNOSIS — E1165 Type 2 diabetes mellitus with hyperglycemia: Secondary | ICD-10-CM

## 2021-01-10 DIAGNOSIS — E559 Vitamin D deficiency, unspecified: Secondary | ICD-10-CM | POA: Diagnosis not present

## 2021-01-10 DIAGNOSIS — E118 Type 2 diabetes mellitus with unspecified complications: Secondary | ICD-10-CM | POA: Diagnosis not present

## 2021-01-10 DIAGNOSIS — I10 Essential (primary) hypertension: Secondary | ICD-10-CM

## 2021-01-10 DIAGNOSIS — Z794 Long term (current) use of insulin: Secondary | ICD-10-CM | POA: Diagnosis not present

## 2021-01-10 DIAGNOSIS — E782 Mixed hyperlipidemia: Secondary | ICD-10-CM | POA: Diagnosis not present

## 2021-01-10 DIAGNOSIS — IMO0002 Reserved for concepts with insufficient information to code with codable children: Secondary | ICD-10-CM

## 2021-01-10 LAB — POCT GLYCOSYLATED HEMOGLOBIN (HGB A1C): HbA1c, POC (controlled diabetic range): 10.2 % — AB (ref 0.0–7.0)

## 2021-01-10 MED ORDER — INSULIN LISPRO (1 UNIT DIAL) 100 UNIT/ML (KWIKPEN): 10.0000 [IU] | PEN_INJECTOR | Freq: Three times a day (TID) | SUBCUTANEOUS | 3 refills | Status: DC

## 2021-01-10 NOTE — Progress Notes (Signed)
01/10/2021   Endocrinology follow-up note      Subjective:    Patient ID: Vicki Ward, female    DOB: [birth year], 59 y.o.   MRN: [id number]   Vicki Ward is a 60 y.o. female presenting on 01/10/2021 for follow up of currently uncontrolled diabetes, hypertension, and hyperlipidemia.  Past Medical History:  Diagnosis Date   Diabetes ([place])    Hypercholesteremia    Family History  Problem Relation Age of Onset   Rectal cancer Sister        age at onset 35, recently diagnosed    Diabetes type I Mother    Diabetes Mother    Hypertension Father    Diabetes Sister    Diabetes Brother     Past Surgical History:  Procedure Laterality Date   APPENDECTOMY     COLONOSCOPY N/A 10/01/2012   Procedure: COLONOSCOPY;  Surgeon: Daneil Dolin, MD;  [facility];  Service: Endoscopy;  Laterality: N/A;  11:00   DILATION AND CURETTAGE OF UTERUS      Current Outpatient Medications:    aspirin 81 MG tablet, Take 81 mg by mouth daily., Disp: , Rfl:    BD PEN NEEDLE NANO U/F 32G X 4 MM MISC, USE 1 NEEDLE WITH INSULIN PEN 4 TIMES A DAY, Disp: 100 each, Rfl: 2   Continuous Blood Gluc Sensor (DEXCOM G6 SENSOR) MISC, USE AS DIRECTED (Patient not taking: Reported on 09/27/2020), Disp: 3 each, Rfl: 3   Continuous Blood Gluc Transmit (DEXCOM G6 TRANSMITTER) MISC, 1 each by Other route See admin instructions. Use as directed to check blood glucose four times daily (Patient not taking: Reported on 09/27/2020), Disp: 1 each, Rfl: 1   enalapril (VASOTEC) 10 MG tablet, TAKE 1 TABLET BY MOUTH EVERY DAY, Disp: 30 tablet, Rfl: 5   glipiZIDE (GLUCOTROL XL) 5 MG 24 hr tablet, Take 1 tablet (5 mg total) by mouth daily with breakfast., Disp: 90 tablet, Rfl: 3   insulin glargine (LANTUS SOLOSTAR) 100 UNIT/ML Solostar Pen, Inject 50 Units into the skin at bedtime., Disp: 45 mL, Rfl: 3   insulin lispro (HUMALOG KWIKPEN) 100 UNIT/ML KwikPen, Inject 10-16 Units into the skin 3 (three) times daily., Disp:  30 mL, Rfl: 3   metFORMIN (GLUCOPHAGE-XR) 500 MG 24 hr tablet, Take 1 tablet (500 mg total) by mouth daily with breakfast., Disp: 90 tablet, Rfl: 3   Multiple Vitamin (MULTIVITAMIN) capsule, Take 1 capsule by mouth daily., Disp: , Rfl:    ONETOUCH VERIO test strip, USE TO TEST BLOOD SUGAR 4 TIMES A DAY, Disp: 100 strip, Rfl: 8   simvastatin (ZOCOR) 40 MG tablet, Take 1 tablet (40 mg total) by mouth at bedtime., Disp: 90 tablet, Rfl: 3  Diabetes She presents for her follow-up diabetic visit. She has type 2 diabetes mellitus. Onset time: She was diagnosed at approximate age of 57 years. Her disease course has been stable. Hypoglycemia symptoms include sweats and tremors. Pertinent negatives for hypoglycemia include no headaches or pallor. Associated symptoms include fatigue, polydipsia and polyuria. Pertinent negatives for diabetes include no chest pain, no foot ulcerations, no polyphagia and no weight loss. There are no hypoglycemic complications. Symptoms are stable. There are no diabetic complications. Risk factors for coronary artery disease include diabetes mellitus, dyslipidemia, hypertension and sedentary lifestyle. Current diabetic treatment includes oral agent (dual therapy) and intensive insulin program. She is compliant with treatment most of the time. Her weight is decreasing steadily. She is following a generally unhealthy diet. When  asked about meal planning, she reported none. She has had a previous visit with a dietitian. She participates in exercise intermittently. Her home blood glucose trend is fluctuating minimally. Her overall blood glucose range is >200 mg/dl. (She presents today with her CGM, no logs, showing tight fasting and above target postprandial glycemic profile.  Her POCT A1c today is 10.2%, unchanged from previous visit of 10.1%.  She admits to still consuming sugary beverages.  She does have some mild fasting hypoglycemia noted.) An ACE inhibitor/angiotensin II receptor blocker  is being taken. She does not see a podiatrist.Eye exam is current.  Hypertension This is a chronic problem. The current episode started more than 1 year ago. The problem has been gradually improving since onset. The problem is controlled. Associated symptoms include sweats. Pertinent negatives include no chest pain, headaches, palpitations or shortness of breath. There are no associated agents to hypertension. Risk factors for coronary artery disease include dyslipidemia, diabetes mellitus, sedentary lifestyle, family history and stress. Past treatments include ACE inhibitors. The current treatment provides moderate improvement. There are no compliance problems.   Hyperlipidemia This is a chronic problem. The current episode started more than 1 year ago. The problem is controlled. Recent lipid tests were reviewed and are high. Exacerbating diseases include diabetes. Factors aggravating her hyperlipidemia include fatty foods. Pertinent negatives include no chest pain or shortness of breath. Current antihyperlipidemic treatment includes statins. The current treatment provides moderate improvement of lipids. Compliance problems include adherence to diet and adherence to exercise.  Risk factors for coronary artery disease include diabetes mellitus, dyslipidemia, hypertension, a sedentary lifestyle and stress.    Review of systems  Constitutional: + steadily decreasing body weight,  current Body mass index is 25.03 kg/m. , + fatigue, no subjective hyperthermia, no subjective hypothermia Eyes: no blurry vision, no xerophthalmia ENT: no sore throat, no nodules palpated in throat, no dysphagia/odynophagia, no hoarseness Cardiovascular: no chest pain, no shortness of breath, no palpitations, no leg swelling Respiratory: no cough, no shortness of breath Gastrointestinal: no nausea/vomiting/diarrhea Musculoskeletal: no muscle/joint aches Skin: no rashes, no hyperemia Neurological: no tremors, no numbness, no  tingling, no dizziness Psychiatric: no depression, no anxiety     Objective:    BP 135/72   Pulse 71   Ht 5' 7.5" (1.715 m)   Wt 162 lb 3.2 oz (73.6 kg)   BMI 25.03 kg/m   Wt Readings from Last 3 Encounters:  01/10/21 162 lb 3.2 oz (73.6 kg)  09/27/20 165 lb (74.8 kg)  02/24/20 175 lb 6.4 oz (79.6 kg)    BP Readings from Last 3 Encounters:  01/10/21 135/72  09/27/20 121/70  02/24/20 117/74     Physical Exam- Limited  Constitutional:  Body mass index is 25.03 kg/m. , not in acute distress, normal state of mind Eyes:  EOMI, no exophthalmos Neck: Supple Cardiovascular: RRR, no murmurs, rubs, or gallops, no edema Respiratory: Adequate breathing efforts, no crackles, rales, rhonchi, or wheezing Musculoskeletal: no gross deformities, strength intact in all four extremities, no gross restriction of joint movements Skin:  no rashes, no hyperemia Neurological: no tremor with outstretched hands      Results for orders placed or performed in visit on 01/10/21  HgB A1c  Result Value Ref Range   Hemoglobin A1C     HbA1c POC (<> result, manual entry)     HbA1c, POC (prediabetic range)     HbA1c, POC (controlled diabetic range) 10.2 (A) 0.0 - 7.0 %   Complete Blood Count (Most  recent): Lab Results  Component Value Date   WBC 11.0 (H) 05/12/2008   HGB 13.1 05/12/2008   HCT 38.8 05/12/2008   MCV 94.3 05/12/2008   PLT 287 05/12/2008    Lipid Panel     Component Value Date/Time   CHOL 224 (H) 06/22/2020 0944   TRIG 37 06/22/2020 0944   HDL 81 06/22/2020 0944   CHOLHDL 2.8 06/22/2020 0944   LDLCALC 137 (H) 06/22/2020 0944       Assessment & Plan:   1) Uncontrolled type 2 DM with long-term insulin use:    - Nutritional counseling repeated at each appointment due to patients tendency to fall back in to old habits.  - The patient admits there is a room for improvement in their diet and drink choices. -  Suggestion is made for the patient to avoid simple  carbohydrates from their diet including Cakes, Sweet Desserts / Pastries, Ice Cream, Soda (diet and regular), Sweet Tea, Candies, Chips, Cookies, Sweet Pastries, Store Bought Juices, Alcohol in Excess of 1-2 drinks a day, Artificial Sweeteners, Coffee Creamer, and "Sugar-free" Products. This will help patient to have stable blood glucose profile and potentially avoid unintended weight gain.   - I encouraged the patient to switch to unprocessed or minimally processed complex starch and increased protein intake (animal or plant source), fruits, and vegetables.   - Patient is advised to stick to a routine mealtimes to eat 3 meals a day and avoid unnecessary snacks (to snack only to correct hypoglycemia).  -She is advised to continue her Lantus at 50 units SQ nightly and increase her Humalog to 10-16 units TID with meals if glucose is above 90 and she is eating (Specific instructions on how to titrate insulin dosage based on glucose readings given to patient in writing).  She can continue Glipizide 5 mg XL daily with breakfast and Metformin 500 mg ER daily with breakfast.  She still has a tendency to skip meals, which can cause wide fluctuations in glucose readings and make her diabetes harder to control.   -She is encouraged to monitor blood glucose at least 4 times per day using her CGM, before meals and at bedtime and report to the clinic if blood glucose readings are less than 70 or greater than 300 for 3 tests in a row.  -Target numbers for a1c, LDL, HDL, Triglycerides,  were discussed in detail.   2) HTN:  Her blood pressure is controlled to target.  She is advised to continue Enalapril 10 mg po daily.  3) Hyperlipidemia:  Her most recent lipid panel from 06/22/20 shows uncontrolled LDL of 137 (worsening).  Her Simvastatin was increased to 40 mg po daily at bedtime at last visit.  Side effects and precautions discussed with her.   4) Chronic care management: -Patient is on ACEI and Statin  medications and encouraged to continue to follow up with Ophthalmology and podiatry at least yearly or according to recommendations, and advised to stay away from smoking.  -Patient is advised to continue f/u with her PMD for primary care needs.       I spent 35 minutes in the care of the patient today including review of labs from [place], Lipids, Thyroid Function, Hematology (current and previous including abstractions from other facilities); face-to-face time discussing  her blood glucose readings/logs, discussing hypoglycemia and hyperglycemia episodes and symptoms, medications doses, her options of short and long term treatment based on the latest standards of care / guidelines;  discussion about incorporating lifestyle  medicine;  and documenting the encounter.    Please refer to Patient Instructions for Blood Glucose Monitoring and Insulin/Medications Dosing Guide"  in media tab for additional information. Please  also refer to " Patient Self Inventory" in the Media  tab for reviewed elements of pertinent patient history.  [name] participated in the discussions, expressed understanding, and voiced agreement with the above plans.  All questions were answered to her satisfaction. she is encouraged to contact clinic should she have any questions or concerns prior to her return visit.   Follow up plan: Return in about 3 months (around 04/12/2021) for Diabetes F/U- A1c and UM in office, No previsit labs, Bring meter and logs.   Rayetta Pigg, [facility] [address] [place], [place] [address] Phone: [phone] Fax: [phone]

## 2021-01-16 ENCOUNTER — Other Ambulatory Visit: Payer: Self-pay | Admitting: "Endocrinology

## 2021-04-11 NOTE — Patient Instructions (Incomplete)

## 2021-04-12 ENCOUNTER — Encounter: Payer: Self-pay | Admitting: Nurse Practitioner

## 2021-04-12 ENCOUNTER — Other Ambulatory Visit: Payer: Self-pay

## 2021-04-12 ENCOUNTER — Ambulatory Visit: Payer: 59 | Admitting: Nurse Practitioner

## 2021-04-12 VITALS — BP 145/78 | HR 65 | Ht 67.5 in | Wt 164.0 lb

## 2021-04-12 DIAGNOSIS — Z794 Long term (current) use of insulin: Secondary | ICD-10-CM

## 2021-04-12 DIAGNOSIS — I10 Essential (primary) hypertension: Secondary | ICD-10-CM

## 2021-04-12 DIAGNOSIS — E559 Vitamin D deficiency, unspecified: Secondary | ICD-10-CM

## 2021-04-12 DIAGNOSIS — E782 Mixed hyperlipidemia: Secondary | ICD-10-CM | POA: Diagnosis not present

## 2021-04-12 DIAGNOSIS — E119 Type 2 diabetes mellitus without complications: Secondary | ICD-10-CM | POA: Diagnosis not present

## 2021-04-12 LAB — POCT GLYCOSYLATED HEMOGLOBIN (HGB A1C): HbA1c, POC (controlled diabetic range): 10.1 % — AB (ref 0.0–7.0)

## 2021-04-12 LAB — POCT UA - MICROALBUMIN
Albumin/Creatinine Ratio, Urine, POC: <30
Creatinine, POC: 200 mg/dL
Microalbumin Ur, POC: 30 mg/L

## 2021-04-12 MED ORDER — INSULIN LISPRO (1 UNIT DIAL) 100 UNIT/ML (KWIKPEN): 10.0000 [IU] | PEN_INJECTOR | Freq: Three times a day (TID) | SUBCUTANEOUS | 3 refills | Status: DC

## 2021-04-12 MED ORDER — DEXCOM G6 TRANSMITTER MISC: 1.0000 | 1 refills | Status: DC

## 2021-04-12 MED ORDER — DEXCOM G6 SENSOR MISC: 3 refills | Status: DC

## 2021-04-12 NOTE — Patient Instructions (Signed)
Diabetes Mellitus and Nutrition, Adult When you have diabetes, or diabetes mellitus, it is very important to have healthy eating habits because your blood sugar (glucose) levels are greatly affected by what you eat and drink. Eating healthy foods in the right amounts, at about the same times every day, can help you:  Control your blood glucose.  Lower your risk of heart disease.  Improve your blood pressure.  Reach or maintain a healthy weight. What can affect my meal plan? Every person with diabetes is different, and each person has different needs for a meal plan. Your health care provider may recommend that you work with a dietitian to make a meal plan that is best for you. Your meal plan may vary depending on factors such as:  The calories you need.  The medicines you take.  Your weight.  Your blood glucose, blood pressure, and cholesterol levels.  Your activity level.  Other health conditions you have, such as heart or kidney disease. How do carbohydrates affect me? Carbohydrates, also called carbs, affect your blood glucose level more than any other type of food. Eating carbs naturally raises the amount of glucose in your blood. Carb counting is a method for keeping track of how many carbs you eat. Counting carbs is important to keep your blood glucose at a healthy level, especially if you use insulin or take certain oral diabetes medicines. It is important to know how many carbs you can safely have in each meal. This is different for every person. Your dietitian can help you calculate how many carbs you should have at each meal and for each snack. How does alcohol affect me? Alcohol can cause a sudden decrease in blood glucose (hypoglycemia), especially if you use insulin or take certain oral diabetes medicines. Hypoglycemia can be a life-threatening condition. Symptoms of hypoglycemia, such as sleepiness, dizziness, and confusion, are similar to symptoms of having too much  alcohol.  Do not drink alcohol if: ? Your health care provider tells you not to drink. ? You are pregnant, may be pregnant, or are planning to become pregnant.  If you drink alcohol: ? Do not drink on an empty stomach. ? Limit how much you use to:  0-1 drink a day for women.  0-2 drinks a day for men. ? Be aware of how much alcohol is in your drink. In the [place] one drink equals one 12 oz bottle of beer (355 mL), one 5 oz glass of wine (148 mL), or one 1 oz glass of hard liquor (44 mL). ? Keep yourself hydrated with water, diet soda, or unsweetened iced tea.  Keep in mind that regular soda, juice, and other mixers may contain a lot of sugar and must be counted as carbs. What are tips for following this plan? Reading food labels  Start by checking the serving size on the "Nutrition Facts" label of packaged foods and drinks. The amount of calories, carbs, fats, and other nutrients listed on the label is based on one serving of the item. Many items contain more than one serving per package.  Check the total grams (g) of carbs in one serving. You can calculate the number of servings of carbs in one serving by dividing the total carbs by 15. For example, if a food has 30 g of total carbs per serving, it would be equal to 2 servings of carbs.  Check the number of grams (g) of saturated fats and trans fats in one serving. Choose foods that have   a low amount or none of these fats.  Check the number of milligrams (mg) of salt (sodium) in one serving. Most people should limit total sodium intake to less than 2,300 mg per day.  Always check the nutrition information of foods labeled as "low-fat" or "nonfat." These foods may be higher in added sugar or refined carbs and should be avoided.  Talk to your dietitian to identify your daily goals for nutrients listed on the label. Shopping  Avoid buying canned, pre-made, or processed foods. These foods tend to be high in fat, sodium, and added  sugar.  Shop around the outside edge of the grocery store. This is where you will most often find fresh fruits and vegetables, bulk grains, fresh meats, and fresh dairy. Cooking  Use low-heat cooking methods, such as baking, instead of high-heat cooking methods like deep frying.  Cook using healthy oils, such as olive, canola, or sunflower oil.  Avoid cooking with butter, cream, or high-fat meats. Meal planning  Eat meals and snacks regularly, preferably at the same times every day. Avoid going long periods of time without eating.  Eat foods that are high in fiber, such as fresh fruits, vegetables, beans, and whole grains. Talk with your dietitian about how many servings of carbs you can eat at each meal.  Eat 4-6 oz (112-168 g) of lean protein each day, such as lean meat, chicken, fish, eggs, or tofu. One ounce (oz) of lean protein is equal to: ? 1 oz (28 g) of meat, chicken, or fish. ? 1 egg. ?  cup (62 g) of tofu.  Eat some foods each day that contain healthy fats, such as avocado, nuts, seeds, and fish.   What foods should I eat? Fruits Berries. Apples. Oranges. Peaches. Apricots. Plums. Grapes. Mango. Papaya. Pomegranate. Kiwi. Cherries. Vegetables Lettuce. Spinach. Leafy greens, including kale, chard, collard greens, and mustard greens. Beets. Cauliflower. Cabbage. Broccoli. Carrots. Green beans. Tomatoes. Peppers. Onions. Cucumbers. Brussels sprouts. Grains Whole grains, such as whole-wheat or whole-grain bread, crackers, tortillas, cereal, and pasta. Unsweetened oatmeal. Quinoa. Brown or wild rice. Meats and other proteins Seafood. Poultry without skin. Lean cuts of poultry and beef. Tofu. Nuts. Seeds. Dairy Low-fat or fat-free dairy products such as milk, yogurt, and cheese. The items listed above may not be a complete list of foods and beverages you can eat. Contact a dietitian for more information. What foods should I avoid? Fruits Fruits canned with  syrup. Vegetables Canned vegetables. Frozen vegetables with butter or cream sauce. Grains Refined white flour and flour products such as bread, pasta, snack foods, and cereals. Avoid all processed foods. Meats and other proteins Fatty cuts of meat. Poultry with skin. Breaded or fried meats. Processed meat. Avoid saturated fats. Dairy Full-fat yogurt, cheese, or milk. Beverages Sweetened drinks, such as soda or iced tea. The items listed above may not be a complete list of foods and beverages you should avoid. Contact a dietitian for more information. Questions to ask a health care provider  Do I need to meet with a diabetes educator?  Do I need to meet with a dietitian?  What number can I call if I have questions?  When are the best times to check my blood glucose? Where to find more information:  American Diabetes Association: diabetes.org  Academy of Nutrition and Dietetics: www.eatright.org  National Institute of Diabetes and Digestive and Kidney Diseases: www.niddk.nih.gov  Association of Diabetes Care and Education Specialists: www.diabeteseducator.org Summary  It is important to have healthy eating   habits because your blood sugar (glucose) levels are greatly affected by what you eat and drink.  A healthy meal plan will help you control your blood glucose and maintain a healthy lifestyle.  Your health care provider may recommend that you work with a dietitian to make a meal plan that is best for you.  Keep in mind that carbohydrates (carbs) and alcohol have immediate effects on your blood glucose levels. It is important to count carbs and to use alcohol carefully. This information is not intended to replace advice given to you by your health care provider. Make sure you discuss any questions you have with your health care provider. Document Revised: 05/04/2019 Document Reviewed: 05/04/2019 Elsevier Patient Education  2021 Elsevier Inc.  

## 2021-04-12 NOTE — Progress Notes (Signed)
04/12/2021   Endocrinology follow-up note      Subjective:    Patient ID: Vicki Ward, female    DOB: 10/01/1960, 60 y.o.   MRN: [id number]   Vicki Ward is a 60 y.o. female presenting on 04/12/2021 for follow up of currently uncontrolled diabetes, hypertension, and hyperlipidemia.  Past Medical History:  Diagnosis Date   Diabetes ([place])    Hypercholesteremia    Family History  Problem Relation Age of Onset   Rectal cancer Sister        age at onset 55, recently diagnosed    Diabetes type I Mother    Diabetes Mother    Hypertension Father    Diabetes Sister    Diabetes Brother     Past Surgical History:  Procedure Laterality Date   APPENDECTOMY     COLONOSCOPY N/A 10/01/2012   Procedure: COLONOSCOPY;  Surgeon: Daneil Dolin, MD;  Location: [facility];  Service: Endoscopy;  Laterality: N/A;  11:00   DILATION AND CURETTAGE OF UTERUS      Current Outpatient Medications:    aspirin 81 MG tablet, Take 81 mg by mouth daily., Disp: , Rfl:    BD PEN NEEDLE NANO U/F 32G X 4 MM MISC, USE 1 NEEDLE WITH INSULIN PEN 4 TIMES A DAY, Disp: 100 each, Rfl: 2   enalapril (VASOTEC) 10 MG tablet, TAKE 1 TABLET BY MOUTH EVERY DAY, Disp: 30 tablet, Rfl: 5   glipiZIDE (GLUCOTROL XL) 5 MG 24 hr tablet, Take 1 tablet (5 mg total) by mouth daily with breakfast., Disp: 90 tablet, Rfl: 3   insulin glargine (LANTUS SOLOSTAR) 100 UNIT/ML Solostar Pen, Inject 50 Units into the skin at bedtime., Disp: 45 mL, Rfl: 3   metFORMIN (GLUCOPHAGE-XR) 500 MG 24 hr tablet, Take 1 tablet (500 mg total) by mouth daily with breakfast., Disp: 90 tablet, Rfl: 3   Multiple Vitamin (MULTIVITAMIN) capsule, Take 1 capsule by mouth daily., Disp: , Rfl:    ONETOUCH VERIO test strip, USE TO TEST BLOOD SUGAR 4 TIMES A DAY, Disp: 100 strip, Rfl: 8   simvastatin (ZOCOR) 40 MG tablet, Take 1 tablet (40 mg total) by mouth at bedtime., Disp: 90 tablet, Rfl: 3   Continuous Blood Gluc Sensor (DEXCOM G6 SENSOR) MISC, USE  AS DIRECTED, Disp: 3 each, Rfl: 3   Continuous Blood Gluc Transmit (DEXCOM G6 TRANSMITTER) MISC, 1 each by Other route See admin instructions. Use as directed to check blood glucose four times daily, Disp: 1 each, Rfl: 1   insulin lispro (HUMALOG KWIKPEN) 100 UNIT/ML KwikPen, Inject 10-16 Units into the skin 3 (three) times daily., Disp: 30 mL, Rfl: 3  Diabetes She presents for her follow-up diabetic visit. She has type 2 diabetes mellitus. Onset time: She was diagnosed at approximate age of 26 years. Her disease course has been stable. Hypoglycemia symptoms include sweats and tremors. Pertinent negatives for hypoglycemia include no headaches or pallor. Associated symptoms include fatigue, polydipsia and polyuria. Pertinent negatives for diabetes include no chest pain, no foot ulcerations, no polyphagia and no weight loss. There are no hypoglycemic complications. Symptoms are stable. There are no diabetic complications. Risk factors for coronary artery disease include diabetes mellitus, dyslipidemia, hypertension and sedentary lifestyle. Current diabetic treatment includes oral agent (dual therapy) and intensive insulin program. She is compliant with treatment most of the time. Her weight is fluctuating minimally. She is following a generally unhealthy diet. When asked about meal planning, she reported none. She has had a previous  visit with a dietitian. She participates in exercise intermittently. Her home blood glucose trend is fluctuating dramatically. (She presents today with her meter and logs showing widely fluctuating glycemic profile and no routine monitoring pattern (likely related to her job working nights).  Her POCT A1c today is 10.1%, essentially unchanged from previous visit.  She ran out of CGM sensors and did not reach out to have them refilled.  She does have some mild hypoglycemia noted, related to meal timing- per patient.) An ACE inhibitor/angiotensin II receptor blocker is being taken. She  does not see a podiatrist.Eye exam is current.  Hypertension This is a chronic problem. The current episode started more than 1 year ago. The problem has been gradually worsening since onset. The problem is uncontrolled. Associated symptoms include sweats. Pertinent negatives include no chest pain, headaches, palpitations or shortness of breath. There are no associated agents to hypertension. Risk factors for coronary artery disease include dyslipidemia, diabetes mellitus, sedentary lifestyle, family history and stress. Past treatments include ACE inhibitors. The current treatment provides moderate improvement. There are no compliance problems.   Hyperlipidemia This is a chronic problem. The current episode started more than 1 year ago. The problem is uncontrolled. Recent lipid tests were reviewed and are high. Exacerbating diseases include diabetes. Factors aggravating her hyperlipidemia include fatty foods. Pertinent negatives include no chest pain or shortness of breath. Current antihyperlipidemic treatment includes statins. The current treatment provides moderate improvement of lipids. Compliance problems include adherence to diet and adherence to exercise.  Risk factors for coronary artery disease include diabetes mellitus, dyslipidemia, hypertension, a sedentary lifestyle and stress.    Review of systems  Constitutional: + Minimally fluctuating body weight,  current Body mass index is 25.31 kg/m. , no fatigue, no subjective hyperthermia, no subjective hypothermia Eyes: no blurry vision, no xerophthalmia ENT: no sore throat, no nodules palpated in throat, no dysphagia/odynophagia, no hoarseness Cardiovascular: no chest pain, no shortness of breath, no palpitations, no leg swelling Respiratory: no cough, no shortness of breath Gastrointestinal: no nausea/vomiting/diarrhea Musculoskeletal: no muscle/joint aches Skin: no rashes, no hyperemia Neurological: no tremors, no numbness, no tingling, no  dizziness Psychiatric: no depression, no anxiety     Objective:    BP (!) 145/78   Pulse 65   Ht 5' 7.5" (1.715 m)   Wt 164 lb (74.4 kg)   BMI 25.31 kg/m   Wt Readings from Last 3 Encounters:  04/12/21 164 lb (74.4 kg)  01/10/21 162 lb 3.2 oz (73.6 kg)  09/27/20 165 lb (74.8 kg)    BP Readings from Last 3 Encounters:  04/12/21 (!) 145/78  01/10/21 135/72  09/27/20 121/70     Physical Exam- Limited  Constitutional:  Body mass index is 25.31 kg/m. , not in acute distress, normal state of mind Eyes:  EOMI, no exophthalmos Neck: Supple Cardiovascular: RRR, no murmurs, rubs, or gallops, no edema Respiratory: Adequate breathing efforts, no crackles, rales, rhonchi, or wheezing Musculoskeletal: no gross deformities, strength intact in all four extremities, no gross restriction of joint movements Skin:  no rashes, no hyperemia Neurological: no tremor with outstretched hands      Results for orders placed or performed in visit on 04/12/21  POCT glycosylated hemoglobin (Hb A1C)  Result Value Ref Range   Hemoglobin A1C     HbA1c POC (<> result, manual entry)     HbA1c, POC (prediabetic range)     HbA1c, POC (controlled diabetic range) 10.1 (A) 0.0 - 7.0 %  POCT UA - Microalbumin  Result Value Ref Range   Microalbumin Ur, POC 30 mg/L   Creatinine, POC 200 mg/dL   Albumin/Creatinine Ratio, Urine, POC <30    Complete Blood Count (Most recent): Lab Results  Component Value Date   WBC 11.0 (H) 05/12/2008   HGB 13.1 05/12/2008   HCT 38.8 05/12/2008   MCV 94.3 05/12/2008   PLT 287 05/12/2008    Lipid Panel     Component Value Date/Time   CHOL 224 (H) 06/22/2020 0944   TRIG 37 06/22/2020 0944   HDL 81 06/22/2020 0944   CHOLHDL 2.8 06/22/2020 0944   LDLCALC 137 (H) 06/22/2020 0944       Assessment & Plan:   1) Uncontrolled type 2 DM with long-term insulin use:  She presents today with her meter and logs showing widely fluctuating glycemic profile and no  routine monitoring pattern (likely related to her job working nights).  Her POCT A1c today is 10.1%, essentially unchanged from previous visit.  She ran out of CGM sensors and did not reach out to have them refilled.  She does have some mild hypoglycemia noted, related to meal timing- per patient.  - Nutritional counseling repeated at each appointment due to patients tendency to fall back in to old habits.  - The patient admits there is a room for improvement in their diet and drink choices. -  Suggestion is made for the patient to avoid simple carbohydrates from their diet including Cakes, Sweet Desserts / Pastries, Ice Cream, Soda (diet and regular), Sweet Tea, Candies, Chips, Cookies, Sweet Pastries, Store Bought Juices, Alcohol in Excess of 1-2 drinks a day, Artificial Sweeteners, Coffee Creamer, and "Sugar-free" Products. This will help patient to have stable blood glucose profile and potentially avoid unintended weight gain.   - I encouraged the patient to switch to unprocessed or minimally processed complex starch and increased protein intake (animal or plant source), fruits, and vegetables.   - Patient is advised to stick to a routine mealtimes to eat 3 meals a day and avoid unnecessary snacks (to snack only to correct hypoglycemia).  -Given the inconsistent glucose monitoring pattern, no changes will be made to her medications today for safety purposes.  Along with dietary changes, she is advised to continue her Lantus at 50 units SQ nightly and increase her Humalog to 10-16 units TID with meals if glucose is above 90 and she is eating (Specific instructions on how to titrate insulin dosage based on glucose readings given to patient in writing).  She can continue Glipizide 5 mg XL daily with breakfast and Metformin 500 mg ER daily with breakfast.  She still has a tendency to skip meals, which can cause wide fluctuations in glucose readings and make her diabetes harder to control.   -She is  encouraged to monitor blood glucose at least 4 times per day using her CGM, before meals and at bedtime and report to the clinic if blood glucose readings are less than 70 or greater than 300 for 3 tests in a row.  -Target numbers for a1c, LDL, HDL, Triglycerides, were discussed in detail.   2) HTN:  Her blood pressure is not controlled to target.  She is advised to continue Enalapril 10 mg po daily.  3) Hyperlipidemia:  Her most recent lipid panel from 06/22/20 shows uncontrolled LDL of 137 (worsening).  Her Simvastatin was increased to 40 mg po daily at bedtime at last visit.  Side effects and precautions discussed with her.  Will recheck lipid panel prior  to next visit.  4) Chronic care management: -Patient is on ACEI and Statin medications and encouraged to continue to follow up with Ophthalmology and podiatry at least yearly or according to recommendations, and advised to stay away from smoking.  -Patient is advised to continue f/u with her PMD for primary care needs.       I spent 30 minutes in the care of the patient today including review of labs from [place], Lipids, Thyroid Function, Hematology (current and previous including abstractions from other facilities); face-to-face time discussing  her blood glucose readings/logs, discussing hypoglycemia and hyperglycemia episodes and symptoms, medications doses, her options of short and long term treatment based on the latest standards of care / guidelines;  discussion about incorporating lifestyle medicine;  and documenting the encounter.    Please refer to Patient Instructions for Blood Glucose Monitoring and Insulin/Medications Dosing Guide"  in media tab for additional information. Please  also refer to " Patient Self Inventory" in the Media  tab for reviewed elements of pertinent patient history.  Vicki Ward participated in the discussions, expressed understanding, and voiced agreement with the above plans.  All questions were  answered to her satisfaction. she is encouraged to contact clinic should she have any questions or concerns prior to her return visit.   Follow up plan: Return in about 3 months (around 07/13/2021) for Diabetes F/U with A1c in office, Previsit labs, Bring meter and logs.   Rayetta Pigg, [facility] [address] [place], [place] [address] Phone: [phone] Fax: [phone]

## 2021-04-18 ENCOUNTER — Other Ambulatory Visit: Payer: Self-pay | Admitting: Nurse Practitioner

## 2021-06-11 ENCOUNTER — Other Ambulatory Visit: Payer: Self-pay | Admitting: Nurse Practitioner

## 2021-06-16 ENCOUNTER — Other Ambulatory Visit: Payer: Self-pay | Admitting: Nurse Practitioner

## 2021-07-13 ENCOUNTER — Ambulatory Visit: Payer: 59 | Admitting: Nurse Practitioner

## 2021-07-13 NOTE — Patient Instructions (Incomplete)

## 2021-07-14 LAB — COMPREHENSIVE METABOLIC PANEL
ALT: 16 IU/L (ref 0–32)
AST: 18 IU/L (ref 0–40)
Albumin/Globulin Ratio: 1.6 (ref 1.2–2.2)
Albumin: 4.4 g/dL (ref 3.8–4.9)
Alkaline Phosphatase: 109 IU/L (ref 44–121)
BUN/Creatinine Ratio: 17 (ref 9–23)
BUN: 12 mg/dL (ref 6–24)
Bilirubin Total: 0.4 mg/dL (ref 0.0–1.2)
CO2: 25 mmol/L (ref 20–29)
Calcium: 9.6 mg/dL (ref 8.7–10.2)
Chloride: 103 mmol/L (ref 96–106)
Creatinine, Ser: 0.72 mg/dL (ref 0.57–1.00)
Globulin, Total: 2.8 g/dL (ref 1.5–4.5)
Glucose: 115 mg/dL — ABNORMAL HIGH (ref 70–99)
Potassium: 4.5 mmol/L (ref 3.5–5.2)
Sodium: 143 mmol/L (ref 134–144)
Total Protein: 7.2 g/dL (ref 6.0–8.5)
eGFR: 96 mL/min/{1.73_m2} (ref >59)

## 2021-07-14 LAB — LIPID PANEL
Chol/HDL Ratio: 2.9 ratio (ref 0.0–4.4)
Cholesterol, Total: 234 mg/dL — ABNORMAL HIGH (ref 100–199)
HDL: 81 mg/dL (ref >39)
LDL Chol Calc (NIH): 145 mg/dL — ABNORMAL HIGH (ref 0–99)
Triglycerides: 51 mg/dL (ref 0–149)
VLDL Cholesterol Cal: 8 mg/dL (ref 5–40)

## 2021-07-14 LAB — T4, FREE: Free T4: 1.29 ng/dL (ref 0.82–1.77)

## 2021-07-14 LAB — TSH: TSH: 0.525 u[IU]/mL (ref 0.450–4.500)

## 2021-07-18 ENCOUNTER — Ambulatory Visit: Payer: 59 | Admitting: Nurse Practitioner

## 2021-07-23 ENCOUNTER — Ambulatory Visit: Payer: 59 | Admitting: Nurse Practitioner

## 2021-07-23 ENCOUNTER — Other Ambulatory Visit: Payer: Self-pay

## 2021-07-23 ENCOUNTER — Encounter: Payer: Self-pay | Admitting: Nurse Practitioner

## 2021-07-23 VITALS — BP 132/70 | HR 71 | Ht 67.5 in | Wt 173.0 lb

## 2021-07-23 DIAGNOSIS — E119 Type 2 diabetes mellitus without complications: Secondary | ICD-10-CM

## 2021-07-23 DIAGNOSIS — I10 Essential (primary) hypertension: Secondary | ICD-10-CM

## 2021-07-23 DIAGNOSIS — E782 Mixed hyperlipidemia: Secondary | ICD-10-CM | POA: Diagnosis not present

## 2021-07-23 DIAGNOSIS — Z794 Long term (current) use of insulin: Secondary | ICD-10-CM | POA: Diagnosis not present

## 2021-07-23 DIAGNOSIS — E559 Vitamin D deficiency, unspecified: Secondary | ICD-10-CM

## 2021-07-23 LAB — POCT GLYCOSYLATED HEMOGLOBIN (HGB A1C): HbA1c POC (<> result, manual entry): 10.3 % (ref 4.0–5.6)

## 2021-07-23 NOTE — Progress Notes (Signed)
07/23/2021   Endocrinology follow-up note      Subjective:    Patient ID: Vicki Ward, female    DOB: March 23, 1961, 61 y.o.   MRN: [id number]   Vicki Ward is a 61 y.o. female presenting on 07/23/2021 for follow up of currently uncontrolled diabetes, hypertension, and hyperlipidemia.  Past Medical History:  Diagnosis Date   Diabetes ([place])    Hypercholesteremia    Family History  Problem Relation Age of Onset   Rectal cancer Sister        age at onset 42, recently diagnosed    Diabetes type I Mother    Diabetes Mother    Hypertension Father    Diabetes Sister    Diabetes Brother     Past Surgical History:  Procedure Laterality Date   APPENDECTOMY     COLONOSCOPY N/A 10/01/2012   Procedure: COLONOSCOPY;  Surgeon: Daneil Dolin, MD;  [facility]: [facility];  Service: Endoscopy;  Laterality: N/A;  11:00   DILATION AND CURETTAGE OF UTERUS      Current Outpatient Medications:    aspirin 81 MG tablet, Take 81 mg by mouth daily., Disp: , Rfl:    BD PEN NEEDLE NANO U/F 32G X 4 MM MISC, USE 1 NEEDLE WITH INSULIN PEN 4 TIMES A DAY, Disp: 100 each, Rfl: 2   Continuous Blood Gluc Sensor (DEXCOM G6 SENSOR) MISC, USE AS DIRECTED, Disp: 3 each, Rfl: 3   Continuous Blood Gluc Transmit (DEXCOM G6 TRANSMITTER) MISC, 1 each by Other route See admin instructions. Use as directed to check blood glucose four times daily, Disp: 1 each, Rfl: 1   enalapril (VASOTEC) 10 MG tablet, TAKE 1 TABLET BY MOUTH EVERY DAY, Disp: 30 tablet, Rfl: 5   glipiZIDE (GLUCOTROL XL) 5 MG 24 hr tablet, Take 1 tablet (5 mg total) by mouth daily with breakfast., Disp: 90 tablet, Rfl: 3   insulin glargine (LANTUS SOLOSTAR) 100 UNIT/ML Solostar Pen, Inject 50 Units into the skin at bedtime., Disp: 45 mL, Rfl: 3   insulin lispro (HUMALOG KWIKPEN) 100 UNIT/ML KwikPen, Inject 10-16 Units into the skin 3 (three) times daily., Disp: 30 mL, Rfl: 3   metFORMIN (GLUCOPHAGE-XR) 500 MG 24 hr tablet, Take 1 tablet (500 mg  total) by mouth daily with breakfast., Disp: 90 tablet, Rfl: 3   Multiple Vitamin (MULTIVITAMIN) capsule, Take 1 capsule by mouth daily., Disp: , Rfl:    ONETOUCH VERIO test strip, USE TO TEST BLOOD SUGAR 4 TIMES A DAY, Disp: 100 strip, Rfl: 8   simvastatin (ZOCOR) 40 MG tablet, TAKE 1 TABLET BY MOUTH EVERYDAY AT BEDTIME, Disp: 90 tablet, Rfl: 1  Diabetes She presents for her follow-up diabetic visit. She has type 2 diabetes mellitus. Onset time: She was diagnosed at approximate age of 24 years. Her disease course has been stable. Hypoglycemia symptoms include sweats and tremors. Pertinent negatives for hypoglycemia include no headaches or pallor. Associated symptoms include fatigue, polydipsia and polyuria. Pertinent negatives for diabetes include no chest pain, no foot ulcerations, no polyphagia and no weight loss. There are no hypoglycemic complications. Symptoms are stable. There are no diabetic complications. Risk factors for coronary artery disease include diabetes mellitus, dyslipidemia, hypertension and sedentary lifestyle. Current diabetic treatment includes oral agent (dual therapy) and intensive insulin program. She is compliant with treatment most of the time. Her weight is increasing steadily. She is following a generally unhealthy diet. When asked about meal planning, she reported none. She has had a previous visit with  a dietitian. She participates in exercise intermittently. Her home blood glucose trend is fluctuating dramatically. (She presents today with her meter, CGM and logs showing widely fluctuating glycemic profile.  Her POCT A1c today is 10.3%, increasing from last visit of 10.1%.  Her work schedule is a barrier for her diabetes management as she works 12hr night shifts.  She does have an erratic eating routine as well.  She has recently been having trouble with her Dexcom sensors.  I recommended she reach out to the company for advice and to replace the sensors she has used that  didn't work.  She asked about the Omnipod today.  ) An ACE inhibitor/angiotensin II receptor blocker is being taken. She does not see a podiatrist.Eye exam is current.  Hypertension This is a chronic problem. The current episode started more than 1 year ago. The problem has been gradually improving since onset. The problem is controlled. Associated symptoms include sweats. Pertinent negatives include no chest pain, headaches, palpitations or shortness of breath. There are no associated agents to hypertension. Risk factors for coronary artery disease include dyslipidemia, diabetes mellitus, sedentary lifestyle, family history and stress. Past treatments include ACE inhibitors. The current treatment provides moderate improvement. There are no compliance problems.   Hyperlipidemia This is a chronic problem. The current episode started more than 1 year ago. The problem is uncontrolled. Recent lipid tests were reviewed and are high. Exacerbating diseases include diabetes. Factors aggravating her hyperlipidemia include fatty foods. Pertinent negatives include no chest pain or shortness of breath. Current antihyperlipidemic treatment includes statins. The current treatment provides moderate improvement of lipids. Compliance problems include adherence to diet and adherence to exercise.  Risk factors for coronary artery disease include diabetes mellitus, dyslipidemia, hypertension, a sedentary lifestyle and stress.    Review of systems  Constitutional: + steadily increasing body weight,  current Body mass index is 26.7 kg/m. , no fatigue, no subjective hyperthermia, no subjective hypothermia Eyes: no blurry vision, no xerophthalmia ENT: no sore throat, no nodules palpated in throat, no dysphagia/odynophagia, no hoarseness Cardiovascular: no chest pain, no shortness of breath, no palpitations, no leg swelling Respiratory: no cough, no shortness of breath Gastrointestinal: no  nausea/vomiting/diarrhea Musculoskeletal: no muscle/joint aches Skin: no rashes, no hyperemia Neurological: no tremors, no numbness, no tingling, no dizziness Psychiatric: no depression, no anxiety     Objective:    BP 132/70    Pulse 71    Ht 5' 7.5" (1.715 m)    Wt 173 lb (78.5 kg)    BMI 26.70 kg/m   Wt Readings from Last 3 Encounters:  07/23/21 173 lb (78.5 kg)  04/12/21 164 lb (74.4 kg)  01/10/21 162 lb 3.2 oz (73.6 kg)    BP Readings from Last 3 Encounters:  07/23/21 132/70  04/12/21 (!) 145/78  01/10/21 135/72     Physical Exam- Limited  Constitutional:  Body mass index is 26.7 kg/m. , not in acute distress, normal state of mind Eyes:  EOMI, no exophthalmos Neck: Supple Cardiovascular: RRR, no murmurs, rubs, or gallops, no edema Respiratory: Adequate breathing efforts, no crackles, rales, rhonchi, or wheezing Musculoskeletal: no gross deformities, strength intact in all four extremities, no gross restriction of joint movements Skin:  no rashes, no hyperemia Neurological: no tremor with outstretched hands      Results for orders placed or performed in visit on 07/23/21  HgB A1c  Result Value Ref Range   Hemoglobin A1C     HbA1c POC (<> result, manual entry) 10.3  4.0 - 5.6 %   HbA1c, POC (prediabetic range)     HbA1c, POC (controlled diabetic range)     Complete Blood Count (Most recent): Lab Results  Component Value Date   WBC 11.0 (H) 05/12/2008   HGB 13.1 05/12/2008   HCT 38.8 05/12/2008   MCV 94.3 05/12/2008   PLT 287 05/12/2008    Lipid Panel     Component Value Date/Time   CHOL 234 (H) 07/13/2021 1009   TRIG 51 07/13/2021 1009   HDL 81 07/13/2021 1009   CHOLHDL 2.9 07/13/2021 1009   LDLCALC 145 (H) 07/13/2021 1009       Assessment & Plan:   1) Uncontrolled type 2 DM with long-term insulin use:  She presents today with her meter, CGM and logs showing widely fluctuating glycemic profile.  Her POCT A1c today is 10.3%, increasing from  last visit of 10.1%.  Her work schedule is a barrier for her diabetes management as she works 12hr night shifts.  She does have an erratic eating routine as well.  She has recently been having trouble with her Dexcom sensors.  I recommended she reach out to the company for advice and to replace the sensors she has used that didn't work.  She asked about the Omnipod today.    - Nutritional counseling repeated at each appointment due to patients tendency to fall back in to old habits.  - The patient admits there is a room for improvement in their diet and drink choices. -  Suggestion is made for the patient to avoid simple carbohydrates from their diet including Cakes, Sweet Desserts / Pastries, Ice Cream, Soda (diet and regular), Sweet Tea, Candies, Chips, Cookies, Sweet Pastries, Store Bought Juices, Alcohol in Excess of 1-2 drinks a day, Artificial Sweeteners, Coffee Creamer, and "Sugar-free" Products. This will help patient to have stable blood glucose profile and potentially avoid unintended weight gain.   - I encouraged the patient to switch to unprocessed or minimally processed complex starch and increased protein intake (animal or plant source), fruits, and vegetables.   - Patient is advised to stick to a routine mealtimes to eat 3 meals a day and avoid unnecessary snacks (to snack only to correct hypoglycemia).  -Given the inconsistent glucose monitoring pattern, no changes will be made to her medications today for safety purposes.  Along with dietary changes, she is advised to continue her Lantus at 50 units SQ nightly and Humalog 10-16 units TID with meals if glucose is above 90 and she is eating (Specific instructions on how to titrate insulin dosage based on glucose readings given to patient in writing).  She can continue Glipizide 5 mg XL daily with breakfast and Metformin 500 mg ER daily with breakfast.  She still has a tendency to skip meals, which can cause wide fluctuations in glucose  readings and make her diabetes harder to control.   -I am having her do some research about the Omnipod Dash to see how affordable it will be for her and we can discuss further at follow up appt.  -She is encouraged to monitor blood glucose at least 4 times per day using her CGM, before meals and at bedtime and report to the clinic if blood glucose readings are less than 70 or greater than 300 for 3 tests in a row.  -Target numbers for a1c, LDL, HDL, Triglycerides, were discussed in detail.   2) HTN:  Her blood pressure is controlled to target.  She is advised to continue  Enalapril 10 mg po daily.  3) Hyperlipidemia:  Her most recent lipid panel from 07/13/21 shows uncontrolled LDL of 145 (worsening).  She is advised to continue Simvastatin 40 mg po daily at bedtime.  Side effects and precautions discussed with her.  She is also advised to avoid fried foods and butter.  4) Chronic care management: -Patient is on ACEI and Statin medications and encouraged to continue to follow up with Ophthalmology and podiatry at least yearly or according to recommendations, and advised to stay away from smoking.  -Patient is advised to continue f/u with her PMD for primary care needs.       I spent 35 minutes in the care of the patient today including review of labs from [facility], Lipids, Thyroid Function, Hematology (current and previous including abstractions from other facilities); face-to-face time discussing  her blood glucose readings/logs, discussing hypoglycemia and hyperglycemia episodes and symptoms, medications doses, her options of short and long term treatment based on the latest standards of care / guidelines;  discussion about incorporating lifestyle medicine;  and documenting the encounter.    Please refer to Patient Instructions for Blood Glucose Monitoring and Insulin/Medications Dosing Guide"  in media tab for additional information. Please  also refer to " Patient Self Inventory" in the Media   tab for reviewed elements of pertinent patient history.  [name] participated in the discussions, expressed understanding, and voiced agreement with the above plans.  All questions were answered to her satisfaction. she is encouraged to contact clinic should she have any questions or concerns prior to her return visit.   Follow up plan: Return in about 2 weeks (around 08/06/2021) for No previsit labs, Bring meter and logs, Diabetes F/U.   Rayetta Pigg, [facility] [address] [place], [place] [address] Phone: [phone] Fax: [phone]

## 2021-07-23 NOTE — Patient Instructions (Signed)

## 2021-08-06 ENCOUNTER — Ambulatory Visit: Payer: 59 | Admitting: Nurse Practitioner

## 2021-08-09 ENCOUNTER — Other Ambulatory Visit: Payer: Self-pay

## 2021-08-09 ENCOUNTER — Other Ambulatory Visit: Payer: Self-pay | Admitting: Nurse Practitioner

## 2021-08-09 MED ORDER — INSULIN LISPRO 100 UNIT/ML ~~LOC~~ SOLN: SUBCUTANEOUS | 0 refills | Status: DC

## 2021-08-22 ENCOUNTER — Other Ambulatory Visit: Payer: Self-pay

## 2021-08-22 ENCOUNTER — Encounter: Payer: Self-pay | Admitting: Nurse Practitioner

## 2021-08-22 ENCOUNTER — Ambulatory Visit (INDEPENDENT_AMBULATORY_CARE_PROVIDER_SITE_OTHER): Payer: 59 | Admitting: Nurse Practitioner

## 2021-08-22 VITALS — BP 138/78 | HR 78 | Ht 67.5 in | Wt 177.8 lb

## 2021-08-22 DIAGNOSIS — E559 Vitamin D deficiency, unspecified: Secondary | ICD-10-CM

## 2021-08-22 DIAGNOSIS — E119 Type 2 diabetes mellitus without complications: Secondary | ICD-10-CM | POA: Diagnosis not present

## 2021-08-22 DIAGNOSIS — E782 Mixed hyperlipidemia: Secondary | ICD-10-CM | POA: Diagnosis not present

## 2021-08-22 DIAGNOSIS — I10 Essential (primary) hypertension: Secondary | ICD-10-CM

## 2021-08-22 DIAGNOSIS — Z794 Long term (current) use of insulin: Secondary | ICD-10-CM | POA: Diagnosis not present

## 2021-08-22 NOTE — Progress Notes (Signed)
?08/22/2021 ? ? ?Endocrinology follow-up note ? ? ? ? ? ?Subjective:  ? ? Patient ID: Vicki Ward, female    DOB: January 09, 1961, 61 y.o.   MRN: [id number] ? ? ?Vicki Ward is a 61 y.o. female presenting on 08/22/2021 for follow up of currently uncontrolled diabetes, hypertension, and hyperlipidemia. ? ?Past Medical History:  ?Diagnosis Date  ? Diabetes ([place])   ? Hypercholesteremia   ? ?Family History  ?Problem Relation Age of Onset  ? Rectal cancer Sister   ?     age at onset 34, recently diagnosed   ? Diabetes type I Mother   ? Diabetes Mother   ? Hypertension Father   ? Diabetes Sister   ? Diabetes Brother   ? ? ?Past Surgical History:  ?Procedure Laterality Date  ? APPENDECTOMY    ? COLONOSCOPY N/A 10/01/2012  ? Procedure: COLONOSCOPY;  Surgeon: Daneil Dolin, MD;  Location[facility];  Service: Endoscopy;  Laterality: N/A;  11:00  ? DILATION AND CURETTAGE OF UTERUS    ? ? ?Current Outpatient Medications:  ?  aspirin 81 MG tablet, Take 81 mg by mouth daily., Disp: , Rfl:  ?  BD PEN NEEDLE NANO U/F 32G X 4 MM MISC, USE 1 NEEDLE WITH INSULIN PEN 4 TIMES A DAY, Disp: 100 each, Rfl: 2 ?  Continuous Blood Gluc Sensor (DEXCOM G6 SENSOR) MISC, USE AS DIRECTED, Disp: 3 each, Rfl: 3 ?  Continuous Blood Gluc Transmit (DEXCOM G6 TRANSMITTER) MISC, 1 each by Other route See admin instructions. Use as directed to check blood glucose four times daily, Disp: 1 each, Rfl: 1 ?  enalapril (VASOTEC) 10 MG tablet, TAKE 1 TABLET BY MOUTH EVERY DAY, Disp: 30 tablet, Rfl: 5 ?  Insulin Disposable Pump (OMNIPOD DASH PDM, GEN 4,) KIT, CHANGE POD EVERY 72 HOURS, Disp: , Rfl:  ?  insulin lispro (HUMALOG) 100 UNIT/ML injection, USE VIA OMNIPOD DASH AS DIRECTED , UP TO 110 UNITS DAILY, Disp: 20 mL, Rfl: 0 ?  metFORMIN (GLUCOPHAGE-XR) 500 MG 24 hr tablet, Take 1 tablet (500 mg total) by mouth daily with breakfast., Disp: 90 tablet, Rfl: 3 ?  Multiple Vitamin (MULTIVITAMIN) capsule, Take 1 capsule by mouth daily., Disp: , Rfl:  ?   ONETOUCH VERIO test strip, USE TO TEST BLOOD SUGAR 4 TIMES A DAY, Disp: 100 strip, Rfl: 8 ?  simvastatin (ZOCOR) 40 MG tablet, TAKE 1 TABLET BY MOUTH EVERYDAY AT BEDTIME, Disp: 90 tablet, Rfl: 1 ? ?Diabetes ?She presents for her follow-up diabetic visit. She has type 2 diabetes mellitus. Onset time: She was diagnosed at approximate age of 23 years. Her disease course has been improving. There are no hypoglycemic associated symptoms. Pertinent negatives for hypoglycemia include no headaches or pallor. There are no diabetic associated symptoms. Pertinent negatives for diabetes include no chest pain, no foot ulcerations, no polyphagia and no weight loss. There are no hypoglycemic complications. Symptoms are stable. There are no diabetic complications. Risk factors for coronary artery disease include diabetes mellitus, dyslipidemia, hypertension and sedentary lifestyle. Current diabetic treatment includes oral agent (dual therapy) and insulin pump. She is compliant with treatment most of the time. Her weight is fluctuating minimally. She is following a generally unhealthy diet. When asked about meal planning, she reported none. She has had a previous visit with a dietitian. She participates in exercise intermittently. Her home blood glucose trend is decreasing steadily. Her overall blood glucose range is 140-180 mg/dl. (She presents today with her CGM and  insulin pump showing greatly improved glycemic profile overall.  She was not due for another A1c today.  She has transitioned to the [employer] well, is able to avoid hypoglycemia.  Analysis of her CGM shows TIR 76%, TAR 22%, TBR 2% with a GMI of 6.8%.) An ACE inhibitor/angiotensin II receptor blocker is being taken. She does not see a podiatrist.Eye exam is current.  ?Hypertension ?This is a chronic problem. The current episode started more than 1 year ago. The problem has been gradually improving since onset. The problem is controlled. Pertinent negatives include  no chest pain, headaches, palpitations or shortness of breath. There are no associated agents to hypertension. Risk factors for coronary artery disease include dyslipidemia, diabetes mellitus, sedentary lifestyle, family history and stress. Past treatments include ACE inhibitors. The current treatment provides moderate improvement. There are no compliance problems.   ?Hyperlipidemia ?This is a chronic problem. The current episode started more than 1 year ago. The problem is uncontrolled. Recent lipid tests were reviewed and are high. Exacerbating diseases include diabetes. Factors aggravating her hyperlipidemia include fatty foods. Pertinent negatives include no chest pain or shortness of breath. Current antihyperlipidemic treatment includes statins. The current treatment provides moderate improvement of lipids. Compliance problems include adherence to diet and adherence to exercise.  Risk factors for coronary artery disease include diabetes mellitus, dyslipidemia, hypertension, a sedentary lifestyle and stress.  ? ? ?Review of systems ? ?Constitutional: + steadily increasing body weight,  current Body mass index is 27.44 kg/m?. , no fatigue, no subjective hyperthermia, no subjective hypothermia ?Eyes: no blurry vision, no xerophthalmia ?ENT: no sore throat, no nodules palpated in throat, no dysphagia/odynophagia, no hoarseness ?Cardiovascular: no chest pain, no shortness of breath, no palpitations, no leg swelling ?Respiratory: no cough, no shortness of breath ?Gastrointestinal: no nausea/vomiting/diarrhea ?Musculoskeletal: no muscle/joint aches ?Skin: no rashes, no hyperemia ?Neurological: no tremors, no numbness, no tingling, no dizziness ?Psychiatric: no depression, no anxiety ? ?   ?Objective:  ?  ?BP 138/78   Pulse 78   Ht 5' 7.5" (1.715 m)   Wt 177 lb 12.8 oz (80.6 kg)   SpO2 99%   BMI 27.44 kg/m?   ?Wt Readings from Last 3 Encounters:  ?08/22/21 177 lb 12.8 oz (80.6 kg)  ?07/23/21 173 lb (78.5 kg)   ?04/12/21 164 lb (74.4 kg)  ?  ?BP Readings from Last 3 Encounters:  ?08/22/21 138/78  ?07/23/21 132/70  ?04/12/21 (!) 145/78  ? ? ?Physical Exam- Limited ? ?Constitutional:  Body mass index is 27.44 kg/m?. , not in acute distress, normal state of mind ?Eyes:  EOMI, no exophthalmos ?Neck: Supple ?Cardiovascular: RRR, no murmurs, rubs, or gallops, no edema ?Respiratory: Adequate breathing efforts, no crackles, rales, rhonchi, or wheezing ?Musculoskeletal: no gross deformities, strength intact in all four extremities, no gross restriction of joint movements ?Skin:  no rashes, no hyperemia ?Neurological: no tremor with outstretched hands ? ? ? ? ? ?Results for orders placed or performed in visit on 07/23/21  ?HgB A1c  ?Result Value Ref Range  ? Hemoglobin A1C    ? HbA1c POC (<> result, manual entry) 10.3 4.0 - 5.6 %  ? HbA1c, POC (prediabetic range)    ? HbA1c, POC (controlled diabetic range)    ? ?Complete Blood Count (Most recent): ?Lab Results  ?Component Value Date  ? WBC 11.0 (H) 05/12/2008  ? HGB 13.1 05/12/2008  ? HCT 38.8 05/12/2008  ? MCV 94.3 05/12/2008  ? PLT 287 05/12/2008  ? ? ?Lipid Panel  ?   ?  Component Value Date/Time  ? CHOL 234 (H) 07/13/2021 1009  ? TRIG 51 07/13/2021 1009  ? HDL 81 07/13/2021 1009  ? CHOLHDL 2.9 07/13/2021 1009  ? LDLCALC 145 (H) 07/13/2021 1009  ? ? ?   ?Assessment & Plan:  ? ?1) Uncontrolled type 2 DM with long-term insulin use: ? ?She presents today with her CGM and insulin pump showing greatly improved glycemic profile overall.  She was not due for another A1c today.  She has transitioned to the [employer] well, is able to avoid hypoglycemia.  Analysis of her CGM shows TIR 76%, TAR 22%, TBR 2% with a GMI of 6.8%. ? ?- Nutritional counseling repeated at each appointment due to patients tendency to fall back in to old habits. ? ?- The patient admits there is a room for improvement in their diet and drink choices. ?-  Suggestion is made for the patient to avoid simple  carbohydrates from their diet including Cakes, Sweet Desserts / Pastries, Ice Cream, Soda (diet and regular), Sweet Tea, Candies, Chips, Cookies, Sweet Pastries, Store Bought Juices, Alcohol in Excess of 1-2 drinks a day, Artif

## 2021-08-22 NOTE — Patient Instructions (Signed)
Diabetes Mellitus Emergency Preparedness Plan ?A diabetes emergency preparedness plan is a checklist to make sure you have everything you need to manage your diabetes in case of an emergency, such as an evacuation, natural disaster, national security emergency, or pandemic lockdown. ?Managing your diabetes is something you have to do all day every day. The American Diabetes Association and the American College of Endocrinology both recommend putting together an emergency diabetes kit. Your kit should include important information and documents as well as all the supplies you will need to manage your diabetes for at least 1 week. Store it in a portable, waterproof bag or container. The best time to start making your emergency kit is now. ?How to make your emergency kit ?Collect information and documents ?Include the following information and documents in your kit: ?The type of diabetes you have. ?A copy of your health insurance cards and photo ID. ?A list of all your other medical conditions, allergies, and surgeries. ?A list of all your medicines and doses with the contact information for your pharmacy. Ask your health care provider for a list of your current medicines. ?Any recent lab results, including your latest hemoglobin A1C (HbA1C). ?The make, model, and serial number of your insulin pump, if you use one. Also include contact information for the manufacturer. ?Contact information for people who should be notified in case of an emergency. Include your health care provider's name, address, and phone number. ?Collect diabetes care items ?Include the following diabetes care items in your kit: ?At least a 1-week supply of: ?Oral medicines. ?Insulin. ?Blood glucose testing supplies. These include testing strips, lancets, and extra batteries for your blood glucose monitor and pump. ?A charger for the continuous glucose monitor (CGM) receiver and pump. ?Any extra supplies needed for your CGM or pump. ?A supply of  glucagon, glucose tablets, juice, soda, or hard candy in case of hypoglycemia. ?Coolers or cold packs. ?A safe container for syringes, needles, and lancets. ? ?Other preparations ?Other things to consider doing as part of your emergency plan: ?Make sure that your mobile phone is charged and that you have an extra charger, cable, or batteries. ?Choose a meeting place for family members. ?Wear a medical alert or ID bracelet. ?If you have a child with diabetes, make sure your child's school has a copy of his or her emergency plan, including the name of the staff member who will assist your child. ?Where to find more information ?American Diabetes Association: www.diabetes.org ?Centers for Disease Control and Prevention: blogs.cdc.gov ?Summary ?A diabetes emergency preparedness plan is a checklist to make sure you have everything you need in case of an emergency. ?Your kit should include important information and documents as well as all the supplies you will need to manage your condition for at least 1 week. ?Store your kit in a portable, waterproof bag or container. ?The best time to start making your emergency kit is now. ?This information is not intended to replace advice given to you by your health care provider. Make sure you discuss any questions you have with your health care provider. ?Document Revised: 12/02/2019 Document Reviewed: 12/02/2019 ?Elsevier Patient Education ? 2022 [employer] ? ?

## 2021-08-30 ENCOUNTER — Other Ambulatory Visit: Payer: Self-pay | Admitting: Nurse Practitioner

## 2021-09-03 ENCOUNTER — Other Ambulatory Visit: Payer: Self-pay | Admitting: "Endocrinology

## 2021-09-13 ENCOUNTER — Other Ambulatory Visit: Payer: Self-pay | Admitting: Nurse Practitioner

## 2021-09-18 ENCOUNTER — Other Ambulatory Visit: Payer: Self-pay | Admitting: Nurse Practitioner

## 2021-10-12 ENCOUNTER — Other Ambulatory Visit: Payer: Self-pay | Admitting: Nurse Practitioner

## 2021-10-16 ENCOUNTER — Other Ambulatory Visit: Payer: Self-pay | Admitting: Nurse Practitioner

## 2021-10-17 ENCOUNTER — Other Ambulatory Visit: Payer: Self-pay | Admitting: Nurse Practitioner

## 2021-10-23 ENCOUNTER — Ambulatory Visit (INDEPENDENT_AMBULATORY_CARE_PROVIDER_SITE_OTHER): Payer: 59 | Admitting: Nurse Practitioner

## 2021-10-23 ENCOUNTER — Encounter: Payer: Self-pay | Admitting: Nurse Practitioner

## 2021-10-23 VITALS — BP 110/69 | HR 67 | Ht 67.5 in | Wt 177.0 lb

## 2021-10-23 DIAGNOSIS — E559 Vitamin D deficiency, unspecified: Secondary | ICD-10-CM

## 2021-10-23 DIAGNOSIS — Z794 Long term (current) use of insulin: Secondary | ICD-10-CM

## 2021-10-23 DIAGNOSIS — E782 Mixed hyperlipidemia: Secondary | ICD-10-CM

## 2021-10-23 DIAGNOSIS — I10 Essential (primary) hypertension: Secondary | ICD-10-CM | POA: Diagnosis not present

## 2021-10-23 DIAGNOSIS — E119 Type 2 diabetes mellitus without complications: Secondary | ICD-10-CM | POA: Diagnosis not present

## 2021-10-23 LAB — POCT GLYCOSYLATED HEMOGLOBIN (HGB A1C): HbA1c POC (<> result, manual entry): 7.3 % (ref 4.0–5.6)

## 2021-10-23 NOTE — Patient Instructions (Signed)
Diabetes Mellitus and Foot Care Foot care is an important part of your health, especially when you have diabetes. Diabetes may cause you to have problems because of poor blood flow (circulation) to your feet and legs, which can cause your skin to: Become thinner and drier. Break more easily. Heal more slowly. Peel and crack. You may also have nerve damage (neuropathy) in your legs and feet, causing decreased feeling in them. This means that you may not notice minor injuries to your feet that could lead to more serious problems. Noticing and addressing any potential problems early is the best way to prevent future foot problems. How to care for your feet Foot hygiene  Wash your feet daily with warm water and mild soap. Do not use hot water. Then, pat your feet and the areas between your toes until they are completely dry. Do not soak your feet as this can dry your skin. Trim your toenails straight across. Do not dig under them or around the cuticle. File the edges of your nails with an emery board or nail file. Apply a moisturizing lotion or petroleum jelly to the skin on your feet and to dry, brittle toenails. Use lotion that does not contain alcohol and is unscented. Do not apply lotion between your toes. Shoes and socks Wear clean socks or stockings every day. Make sure they are not too tight. Do not wear knee-high stockings since they may decrease blood flow to your legs. Wear shoes that fit properly and have enough cushioning. Always look in your shoes before you put them on to be sure there are no objects inside. To break in new shoes, wear them for just a few hours a day. This prevents injuries on your feet. Wounds, scrapes, corns, and calluses  Check your feet daily for blisters, cuts, bruises, sores, and redness. If you cannot see the bottom of your feet, use a mirror or ask someone for help. Do not cut corns or calluses or try to remove them with medicine. If you find a minor scrape,  cut, or break in the skin on your feet, keep it and the skin around it clean and dry. You may clean these areas with mild soap and water. Do not clean the area with peroxide, alcohol, or iodine. If you have a wound, scrape, corn, or callus on your foot, look at it several times a day to make sure it is healing and not infected. Check for: Redness, swelling, or pain. Fluid or blood. Warmth. Pus or a bad smell. General tips Do not cross your legs. This may decrease blood flow to your feet. Do not use heating pads or hot water bottles on your feet. They may burn your skin. If you have lost feeling in your feet or legs, you may not know this is happening until it is too late. Protect your feet from hot and cold by wearing shoes, such as at the beach or on hot pavement. Schedule a complete foot exam at least once a year (annually) or more often if you have foot problems. Report any cuts, sores, or bruises to your health care provider immediately. Where to find more information American Diabetes Association: www.diabetes.org Association of [employer] Education Specialists: www.diabeteseducator.org Contact a health care provider if: You have a medical condition that increases your risk of infection and you have any cuts, sores, or bruises on your feet. You have an injury that is not healing. You have redness on your legs or feet. You   feel burning or tingling in your legs or feet. You have pain or cramps in your legs and feet. Your legs or feet are numb. Your feet always feel cold. You have pain around any toenails. Get help right away if: You have a wound, scrape, corn, or callus on your foot and: You have pain, swelling, or redness that gets worse. You have fluid or blood coming from the wound, scrape, corn, or callus. Your wound, scrape, corn, or callus feels warm to the touch. You have pus or a bad smell coming from the wound, scrape, corn, or callus. You have a fever. You have a red  line going up your leg. Summary Check your feet every day for blisters, cuts, bruises, sores, and redness. Apply a moisturizing lotion or petroleum jelly to the skin on your feet and to dry, brittle toenails. Wear shoes that fit properly and have enough cushioning. If you have foot problems, report any cuts, sores, or bruises to your health care provider immediately. Schedule a complete foot exam at least once a year (annually) or more often if you have foot problems. This information is not intended to replace advice given to you by your health care provider. Make sure you discuss any questions you have with your health care provider. Document Revised: 12/16/2019 Document Reviewed: 12/16/2019 Elsevier Patient Education  2023 [employer]  

## 2021-10-23 NOTE — Progress Notes (Signed)
?10/23/2021 ? ? ?Endocrinology follow-up note ? ? ? ? ? ?Subjective:  ? ? Patient ID: Vicki Ward, female    DOB: 05-Jul-1960, 61 y.o.   MRN: [id number] ? ? ?Vicki Ward is a 61 y.o. female presenting on 10/23/2021 for follow up of currently uncontrolled diabetes, hypertension, and hyperlipidemia. ? ?Past Medical History:  ?Diagnosis Date  ? Diabetes ([place])   ? Hypercholesteremia   ? ?Family History  ?Problem Relation Age of Onset  ? Rectal cancer Sister   ?     age at onset 90, recently diagnosed   ? Diabetes type I Mother   ? Diabetes Mother   ? Hypertension Father   ? Diabetes Sister   ? Diabetes Brother   ? ? ?Past Surgical History:  ?Procedure Laterality Date  ? APPENDECTOMY    ? COLONOSCOPY N/A 10/01/2012  ? Procedure: COLONOSCOPY;  Surgeon: Daneil Dolin, MD;  Location: [facility];  Service: Endoscopy;  Laterality: N/A;  11:00  ? DILATION AND CURETTAGE OF UTERUS    ? ? ?Current Outpatient Medications:  ?  aspirin 81 MG tablet, Take 81 mg by mouth daily., Disp: , Rfl:  ?  BD PEN NEEDLE NANO U/F 32G X 4 MM MISC, USE 1 NEEDLE WITH INSULIN PEN 4 TIMES A DAY, Disp: 100 each, Rfl: 2 ?  Continuous Blood Gluc Sensor (DEXCOM G6 SENSOR) MISC, USE AS DIRECTED, Disp: 3 each, Rfl: 3 ?  Continuous Blood Gluc Transmit (DEXCOM G6 TRANSMITTER) MISC, 1 each by Other route See admin instructions. Use as directed to check blood glucose four times daily, Disp: 1 each, Rfl: 1 ?  enalapril (VASOTEC) 10 MG tablet, TAKE 1 TABLET BY MOUTH EVERY DAY, Disp: 30 tablet, Rfl: 5 ?  Insulin Disposable Pump (OMNIPOD DASH PDM, GEN 4,) KIT, CHANGE POD EVERY 72 HOURS, Disp: , Rfl:  ?  insulin lispro (HUMALOG) 100 UNIT/ML injection, USE VIA OMNIPOD DASH AS DIRECTED , UP TO 110 UNITS DAILY, Disp: 60 mL, Rfl: 1 ?  Multiple Vitamin (MULTIVITAMIN) capsule, Take 1 capsule by mouth daily., Disp: , Rfl:  ?  ONETOUCH VERIO test strip, USE TO TEST BLOOD SUGAR 4 TIMES A DAY, Disp: 100 strip, Rfl: 8 ?  simvastatin (ZOCOR) 40 MG tablet, TAKE 1 TABLET  BY MOUTH EVERYDAY AT BEDTIME, Disp: 30 tablet, Rfl: 5 ? ?Diabetes ?She presents for her follow-up diabetic visit. She has type 2 diabetes mellitus. Onset time: She was diagnosed at approximate age of 43 years. Her disease course has been improving. There are no hypoglycemic associated symptoms. Pertinent negatives for hypoglycemia include no headaches or pallor. There are no diabetic associated symptoms. Pertinent negatives for diabetes include no chest pain, no foot ulcerations, no polyphagia and no weight loss. There are no hypoglycemic complications. Symptoms are stable. There are no diabetic complications. Risk factors for coronary artery disease include diabetes mellitus, dyslipidemia, hypertension and sedentary lifestyle. Current diabetic treatment includes insulin pump and oral agent (monotherapy). She is compliant with treatment most of the time. Her weight is fluctuating minimally. She is following a generally unhealthy diet. When asked about meal planning, she reported none. She has had a previous visit with a dietitian. She participates in exercise intermittently. Her home blood glucose trend is decreasing steadily. Her overall blood glucose range is 140-180 mg/dl. (She presents today with her CGM and Omnipod showing at target glycemic profile overall.  Her POCT A1c today is 7.3%, improving drastically from last visit of 10.3%.  She has less fluctuations  in glucose and is loving her new regimen.  Analysis of her CGM shows TIR 76%, TAR 22%, TBR 2%, with a GMI of 6.8%.  She denies any significant hypoglycemia.) An ACE inhibitor/angiotensin II receptor blocker is being taken. She does not see a podiatrist.Eye exam is current.  ?Hypertension ?This is a chronic problem. The current episode started more than 1 year ago. The problem has been gradually improving since onset. The problem is controlled. Pertinent negatives include no chest pain, headaches, palpitations or shortness of breath. There are no  associated agents to hypertension. Risk factors for coronary artery disease include dyslipidemia, diabetes mellitus, sedentary lifestyle, family history and stress. Past treatments include ACE inhibitors. The current treatment provides moderate improvement. There are no compliance problems.   ?Hyperlipidemia ?This is a chronic problem. The current episode started more than 1 year ago. The problem is uncontrolled. Recent lipid tests were reviewed and are high. Exacerbating diseases include diabetes. Factors aggravating her hyperlipidemia include fatty foods. Pertinent negatives include no chest pain or shortness of breath. Current antihyperlipidemic treatment includes statins. The current treatment provides moderate improvement of lipids. Compliance problems include adherence to diet and adherence to exercise.  Risk factors for coronary artery disease include diabetes mellitus, dyslipidemia, hypertension, a sedentary lifestyle and stress.  ? ? ?Review of systems ? ?Constitutional: + Minimally fluctuating body weight,  current Body mass index is 27.31 kg/m?. , no fatigue, no subjective hyperthermia, no subjective hypothermia ?Eyes: no blurry vision, no xerophthalmia ?ENT: no sore throat, no nodules palpated in throat, no dysphagia/odynophagia, no hoarseness ?Cardiovascular: no chest pain, no shortness of breath, no palpitations, no leg swelling ?Respiratory: no cough, no shortness of breath ?Gastrointestinal: no nausea/vomiting/diarrhea ?Musculoskeletal: no muscle/joint aches ?Skin: no rashes, no hyperemia ?Neurological: no tremors, no numbness, no tingling, no dizziness ?Psychiatric: no depression, no anxiety ? ?   ?Objective:  ?  ?BP 110/69   Pulse 67   Ht 5' 7.5" (1.715 m)   Wt 177 lb (80.3 kg)   BMI 27.31 kg/m?   ?Wt Readings from Last 3 Encounters:  ?10/23/21 177 lb (80.3 kg)  ?08/22/21 177 lb 12.8 oz (80.6 kg)  ?07/23/21 173 lb (78.5 kg)  ?  ?BP Readings from Last 3 Encounters:  ?10/23/21 110/69  ?08/22/21  138/78  ?07/23/21 132/70  ? ? ? ?Physical Exam- Limited ? ?Constitutional:  Body mass index is 27.31 kg/m?. , not in acute distress, normal state of mind ?Eyes:  EOMI, no exophthalmos ?Neck: Supple ?Cardiovascular: RRR, no murmurs, rubs, or gallops, no edema ?Respiratory: Adequate breathing efforts, no crackles, rales, rhonchi, or wheezing ?Musculoskeletal: no gross deformities, strength intact in all four extremities, no gross restriction of joint movements ?Skin:  no rashes, no hyperemia ?Neurological: no tremor with outstretched hands ? ? ? ?Results for orders placed or performed in visit on 10/23/21  ?HgB A1c  ?Result Value Ref Range  ? Hemoglobin A1C    ? HbA1c POC (<> result, manual entry) 7.3 4.0 - 5.6 %  ? HbA1c, POC (prediabetic range)    ? HbA1c, POC (controlled diabetic range)    ? ?Complete Blood Count (Most recent): ?Lab Results  ?Component Value Date  ? WBC 11.0 (H) 05/12/2008  ? HGB 13.1 05/12/2008  ? HCT 38.8 05/12/2008  ? MCV 94.3 05/12/2008  ? PLT 287 05/12/2008  ? ? ?Lipid Panel  ?   ?Component Value Date/Time  ? CHOL 234 (H) 07/13/2021 1009  ? TRIG 51 07/13/2021 1009  ? HDL 81 07/13/2021 1009  ?  CHOLHDL 2.9 07/13/2021 1009  ? LDLCALC 145 (H) 07/13/2021 1009  ? ? ?   ?Assessment & Plan:  ? ?1) Uncontrolled type 2 DM with long-term insulin use: ? ?She presents today with her CGM and Omnipod showing at target glycemic profile overall.  Her POCT A1c today is 7.3%, improving drastically from last visit of 10.3%.  She has less fluctuations in glucose and is loving her new regimen.  Analysis of her CGM shows TIR 76%, TAR 22%, TBR 2%, with a GMI of 6.8%.  She denies any significant hypoglycemia. ? ?- Nutritional counseling repeated at each appointment due to patients tendency to fall back in to old habits. ? ?- The patient admits there is a room for improvement in their diet and drink choices. ?-  Suggestion is made for the patient to avoid simple carbohydrates from their diet including Cakes, Sweet  Desserts / Pastries, Ice Cream, Soda (diet and regular), Sweet Tea, Candies, Chips, Cookies, Sweet Pastries, Store Bought Juices, Alcohol in Excess of 1-2 drinks a day, Artificial Sweeteners, Coffee Creamer, and "Sugar

## 2021-12-16 ENCOUNTER — Other Ambulatory Visit: Payer: Self-pay | Admitting: Nurse Practitioner

## 2021-12-23 ENCOUNTER — Other Ambulatory Visit: Payer: Self-pay | Admitting: Nurse Practitioner

## 2022-02-25 ENCOUNTER — Ambulatory Visit: Payer: 59 | Admitting: Nurse Practitioner

## 2022-02-25 ENCOUNTER — Encounter: Payer: Self-pay | Admitting: Nurse Practitioner

## 2022-02-25 VITALS — BP 133/72 | HR 65 | Ht 67.0 in | Wt 172.0 lb

## 2022-02-25 DIAGNOSIS — E559 Vitamin D deficiency, unspecified: Secondary | ICD-10-CM

## 2022-02-25 DIAGNOSIS — I10 Essential (primary) hypertension: Secondary | ICD-10-CM | POA: Diagnosis not present

## 2022-02-25 DIAGNOSIS — E782 Mixed hyperlipidemia: Secondary | ICD-10-CM

## 2022-02-25 DIAGNOSIS — E119 Type 2 diabetes mellitus without complications: Secondary | ICD-10-CM | POA: Diagnosis not present

## 2022-02-25 DIAGNOSIS — Z794 Long term (current) use of insulin: Secondary | ICD-10-CM

## 2022-02-25 LAB — POCT GLYCOSYLATED HEMOGLOBIN (HGB A1C): Hemoglobin A1C: 7.4 % — AB (ref 4.0–5.6)

## 2022-02-25 MED ORDER — DEXCOM G6 TRANSMITTER MISC: 1.0000 | 3 refills | Status: DC

## 2022-02-25 NOTE — Progress Notes (Signed)
02/25/2022   Endocrinology follow-up note      Subjective:    Patient ID: Vicki Ward, female    DOB: 1960/11/07, 61 y.o.   MRN: [id number]   Vicki Ward is a 61 y.o. female presenting on 02/25/2022 for follow up of currently uncontrolled diabetes, hypertension, and hyperlipidemia.  Past Medical History:  Diagnosis Date   Diabetes ([place])    Hypercholesteremia    Family History  Problem Relation Age of Onset   Rectal cancer Sister        age at onset 28, recently diagnosed    Diabetes type I Mother    Diabetes Mother    Hypertension Father    Diabetes Sister    Diabetes Brother     Past Surgical History:  Procedure Laterality Date   APPENDECTOMY     COLONOSCOPY N/A 10/01/2012   Procedure: COLONOSCOPY;  Surgeon: Daneil Dolin, MD;  [facility];  Service: Endoscopy;  Laterality: N/A;  11:00   DILATION AND CURETTAGE OF UTERUS      Current Outpatient Medications:    aspirin 81 MG tablet, Take 81 mg by mouth daily., Disp: , Rfl:    Continuous Blood Gluc Sensor (DEXCOM G6 SENSOR) MISC, USE AS DIRECTED, Disp: 3 each, Rfl: 3   enalapril (VASOTEC) 10 MG tablet, TAKE 1 TABLET BY MOUTH EVERY DAY, Disp: 30 tablet, Rfl: 5   Insulin Disposable Pump (OMNIPOD DASH PDM, GEN 4,) KIT, CHANGE POD EVERY 72 HOURS, Disp: , Rfl:    insulin lispro (HUMALOG) 100 UNIT/ML injection, USE VIA OMNIPOD DASH AS DIRECTED , UP TO 110 UNITS DAILY, Disp: 60 mL, Rfl: 1   Multiple Vitamin (MULTIVITAMIN) capsule, Take 1 capsule by mouth daily., Disp: , Rfl:    ONETOUCH VERIO test strip, USE TO TEST BLOOD SUGAR 4 TIMES A DAY, Disp: 100 strip, Rfl: 8   simvastatin (ZOCOR) 40 MG tablet, TAKE 1 TABLET BY MOUTH EVERYDAY AT BEDTIME, Disp: 30 tablet, Rfl: 5   BD PEN NEEDLE NANO U/F 32G X 4 MM MISC, USE 1 NEEDLE WITH INSULIN PEN 4 TIMES A DAY (Patient not taking: Reported on 02/25/2022), Disp: 100 each, Rfl: 2   Continuous Blood Gluc Transmit (DEXCOM G6 TRANSMITTER) MISC, 1 each by Other route See  admin instructions. Use as directed to check blood glucose four times daily, Disp: 1 each, Rfl: 3  Diabetes She presents for her follow-up diabetic visit. She has type 2 diabetes mellitus. Onset time: She was diagnosed at approximate age of 70 years. Her disease course has been stable. There are no hypoglycemic associated symptoms. Pertinent negatives for hypoglycemia include no headaches or pallor. There are no diabetic associated symptoms. Pertinent negatives for diabetes include no chest pain, no foot ulcerations, no polyphagia and no weight loss. There are no hypoglycemic complications. Symptoms are stable. There are no diabetic complications. Risk factors for coronary artery disease include diabetes mellitus, dyslipidemia, hypertension and sedentary lifestyle. Current diabetic treatment includes insulin pump and oral agent (monotherapy). She is compliant with treatment most of the time. Her weight is fluctuating minimally. She is following a generally unhealthy diet. When asked about meal planning, she reported none. She has had a previous visit with a dietitian. She participates in exercise intermittently. Her home blood glucose trend is fluctuating minimally. Her overall blood glucose range is 140-180 mg/dl. (She presents today with her CGM and Omnipod DASH showing stable glycemic profile overall.  Her POCT A1c today is 7.4%, essentially unchanged from last visit.  Analysis of her CGM shows TIR 71%, TAR 29%, TBR 0%.  She is taking approximately 40 units of insulin per day, not bolusing consistently.  She denies any significant hypoglycemia.) An ACE inhibitor/angiotensin II receptor blocker is being taken. She does not see a podiatrist.Eye exam is current.  Hypertension This is a chronic problem. The current episode started more than 1 year ago. The problem has been gradually improving since onset. The problem is controlled. Pertinent negatives include no chest pain, headaches, palpitations or shortness  of breath. There are no associated agents to hypertension. Risk factors for coronary artery disease include dyslipidemia, diabetes mellitus, sedentary lifestyle, family history and stress. Past treatments include ACE inhibitors. The current treatment provides moderate improvement. There are no compliance problems.   Hyperlipidemia This is a chronic problem. The current episode started more than 1 year ago. The problem is uncontrolled. Recent lipid tests were reviewed and are high. Exacerbating diseases include diabetes. Factors aggravating her hyperlipidemia include fatty foods. Pertinent negatives include no chest pain or shortness of breath. Current antihyperlipidemic treatment includes statins. The current treatment provides moderate improvement of lipids. Compliance problems include adherence to diet and adherence to exercise.  Risk factors for coronary artery disease include diabetes mellitus, dyslipidemia, hypertension, a sedentary lifestyle and stress.     Review of systems  Constitutional: + Minimally fluctuating body weight,  current Body mass index is 26.94 kg/m. , no fatigue, no subjective hyperthermia, no subjective hypothermia Eyes: no blurry vision, no xerophthalmia ENT: no sore throat, no nodules palpated in throat, no dysphagia/odynophagia, no hoarseness Cardiovascular: no chest pain, no shortness of breath, no palpitations, no leg swelling Respiratory: no cough, no shortness of breath Gastrointestinal: no nausea/vomiting/diarrhea Musculoskeletal: no muscle/joint aches Skin: no rashes, no hyperemia Neurological: no tremors, no numbness, no tingling, no dizziness Psychiatric: no depression, no anxiety     Objective:    BP 133/72 (BP Location: Right Arm, Patient Position: Sitting, Cuff Size: Large)   Pulse 65   Ht '5\' 7"'  (1.702 m)   Wt 172 lb (78 kg)   BMI 26.94 kg/m   Wt Readings from Last 3 Encounters:  02/25/22 172 lb (78 kg)  10/23/21 177 lb (80.3 kg)  08/22/21 177 lb  12.8 oz (80.6 kg)    BP Readings from Last 3 Encounters:  02/25/22 133/72  10/23/21 110/69  08/22/21 138/78    Physical Exam- Limited  Constitutional:  Body mass index is 26.94 kg/m. , not in acute distress, normal state of mind Eyes:  EOMI, no exophthalmos Neck: Supple Cardiovascular: RRR, no murmurs, rubs, or gallops, no edema Respiratory: Adequate breathing efforts, no crackles, rales, rhonchi, or wheezing Musculoskeletal: no gross deformities, strength intact in all four extremities, no gross restriction of joint movements Skin:  no rashes, no hyperemia Neurological: no tremor with outstretched hands  Diabetic Foot Exam - Simple   Simple Foot Form Diabetic Foot exam was performed with the following findings: Yes 02/25/2022 10:01 AM  Visual Inspection See comments: Yes Sensation Testing Intact to touch and monofilament testing bilaterally: Yes Pulse Check Posterior Tibialis and Dorsalis pulse intact bilaterally: Yes Comments Mild onychomycosis bilaterally with mild toe deformity     Results for orders placed or performed in visit on 02/25/22  HgB A1c  Result Value Ref Range   Hemoglobin A1C 7.4 (A) 4.0 - 5.6 %   HbA1c POC (<> result, manual entry)     HbA1c, POC (prediabetic range)     HbA1c, POC (controlled diabetic range)  Complete Blood Count (Most recent): Lab Results  Component Value Date   WBC 11.0 (H) 05/12/2008   HGB 13.1 05/12/2008   HCT 38.8 05/12/2008   MCV 94.3 05/12/2008   PLT 287 05/12/2008    Lipid Panel     Component Value Date/Time   CHOL 234 (H) 07/13/2021 1009   TRIG 51 07/13/2021 1009   HDL 81 07/13/2021 1009   CHOLHDL 2.9 07/13/2021 1009   LDLCALC 145 (H) 07/13/2021 1009       Assessment & Plan:   1) Uncontrolled type 2 DM with long-term insulin use:  She presents today with her CGM and Omnipod DASH showing stable glycemic profile overall.  Her POCT A1c today is 7.4%, essentially unchanged from last visit.  Analysis of her  CGM shows TIR 71%, TAR 29%, TBR 0%.  She is taking approximately 40 units of insulin per day, not bolusing consistently.  She denies any significant hypoglycemia.  - Nutritional counseling repeated at each appointment due to patients tendency to fall back in to old habits.  - The patient admits there is a room for improvement in their diet and drink choices. -  Suggestion is made for the patient to avoid simple carbohydrates from their diet including Cakes, Sweet Desserts / Pastries, Ice Cream, Soda (diet and regular), Sweet Tea, Candies, Chips, Cookies, Sweet Pastries, Store Bought Juices, Alcohol in Excess of 1-2 drinks a day, Artificial Sweeteners, Coffee Creamer, and "Sugar-free" Products. This will help patient to have stable blood glucose profile and potentially avoid unintended weight gain.   - I encouraged the patient to switch to unprocessed or minimally processed complex starch and increased protein intake (animal or plant source), fruits, and vegetables.   - Patient is advised to stick to a routine mealtimes to eat 3 meals a day and avoid unnecessary snacks (to snack only to correct hypoglycemia).  -She is advised to continue her current Omnipod pump settings but is advised to bolus more consistently before meals to prevent postprandial hyperglycemia.  -She is encouraged to monitor blood glucose at least 4 times per day using her CGM, before meals and at bedtime and report to the clinic if blood glucose readings are less than 70 or greater than 300 for 3 tests in a row.  -Target numbers for a1c, LDL, HDL, Triglycerides, were discussed in detail.   2) HTN:  Her blood pressure is controlled to target.  She is advised to continue Enalapril 10 mg po daily.  3) Hyperlipidemia:  Her most recent lipid panel from 07/13/21 shows uncontrolled LDL of 145 (worsening).  She is advised to continue Simvastatin 40 mg po daily at bedtime.  Side effects and precautions discussed with her.  She is also  advised to avoid fried foods and butter.  4) Chronic care management: -Patient is on ACEI and Statin medications and encouraged to continue to follow up with Ophthalmology and podiatry at least yearly or according to recommendations, and advised to stay away from smoking.  -Patient is advised to continue f/u with her PMD for primary care needs.     I spent 33 minutes in the care of the patient today including review of labs from [place], Lipids, Thyroid Function, Hematology (current and previous including abstractions from other facilities); face-to-face time discussing  her blood glucose readings/logs, discussing hypoglycemia and hyperglycemia episodes and symptoms, medications doses, her options of short and long term treatment based on the latest standards of care / guidelines;  discussion about incorporating lifestyle medicine;  and  documenting the encounter. Risk reduction counseling performed per USPSTF guidelines to reduce obesity and cardiovascular risk factors.     Please refer to Patient Instructions for Blood Glucose Monitoring and Insulin/Medications Dosing Guide"  in media tab for additional information. Please  also refer to " Patient Self Inventory" in the Media  tab for reviewed elements of pertinent patient history.  Vicki Ward participated in the discussions, expressed understanding, and voiced agreement with the above plans.  All questions were answered to her satisfaction. she is encouraged to contact clinic should she have any questions or concerns prior to her return visit.   Follow up plan: Return in about 4 months (around 06/27/2022) for Diabetes F/U with A1c in office, No previsit labs, Bring meter and logs.   Vicki Ward, [facility] [address] [place], [place] [address] Phone: [phone] Fax: [phone]

## 2022-04-21 ENCOUNTER — Other Ambulatory Visit: Payer: Self-pay | Admitting: Nurse Practitioner

## 2022-06-11 ENCOUNTER — Other Ambulatory Visit: Payer: Self-pay | Admitting: Nurse Practitioner

## 2022-06-19 ENCOUNTER — Other Ambulatory Visit: Payer: Self-pay | Admitting: Nurse Practitioner

## 2022-06-25 ENCOUNTER — Other Ambulatory Visit: Payer: Self-pay | Admitting: Nurse Practitioner

## 2022-06-27 ENCOUNTER — Ambulatory Visit: Payer: 59 | Admitting: Nurse Practitioner

## 2022-06-27 ENCOUNTER — Encounter: Payer: Self-pay | Admitting: Nurse Practitioner

## 2022-06-27 VITALS — BP 139/79 | HR 66 | Ht 67.0 in | Wt 175.6 lb

## 2022-06-27 DIAGNOSIS — I10 Essential (primary) hypertension: Secondary | ICD-10-CM

## 2022-06-27 DIAGNOSIS — Z794 Long term (current) use of insulin: Secondary | ICD-10-CM | POA: Diagnosis not present

## 2022-06-27 DIAGNOSIS — E782 Mixed hyperlipidemia: Secondary | ICD-10-CM

## 2022-06-27 DIAGNOSIS — E119 Type 2 diabetes mellitus without complications: Secondary | ICD-10-CM | POA: Diagnosis not present

## 2022-06-27 DIAGNOSIS — E559 Vitamin D deficiency, unspecified: Secondary | ICD-10-CM | POA: Diagnosis not present

## 2022-06-27 LAB — POCT GLYCOSYLATED HEMOGLOBIN (HGB A1C): Hemoglobin A1C: 7.7 % — AB (ref 4.0–5.6)

## 2022-06-27 LAB — POCT UA - MICROALBUMIN

## 2022-06-27 MED ORDER — INSULIN LISPRO 100 UNIT/ML IJ SOLN: INTRAMUSCULAR | 1 refills | Status: DC

## 2022-06-27 NOTE — Progress Notes (Signed)
06/27/2022   Endocrinology follow-up note      Subjective:    Patient ID: Vicki Ward, female    DOB: 01-Jan-1961, 62 y.o.   MRN: [id number]   Vicki Ward is a 62 y.o. female presenting on 06/27/2022 for follow up of currently uncontrolled diabetes, hypertension, and hyperlipidemia.  Past Medical History:  Diagnosis Date   Diabetes ([place])    Hypercholesteremia    Family History  Problem Relation Age of Onset   Rectal cancer Sister        age at onset 32, recently diagnosed    Diabetes type I Mother    Diabetes Mother    Hypertension Father    Diabetes Sister    Diabetes Brother     Past Surgical History:  Procedure Laterality Date   APPENDECTOMY     COLONOSCOPY N/A 10/01/2012   Procedure: COLONOSCOPY;  Surgeon: Daneil Dolin, MD;  Location[facility];  Service: Endoscopy;  Laterality: N/A;  11:00   DILATION AND CURETTAGE OF UTERUS      Current Outpatient Medications:    aspirin 81 MG tablet, Take 81 mg by mouth daily., Disp: , Rfl:    Continuous Blood Gluc Sensor (DEXCOM G6 SENSOR) MISC, USE AS DIRECTED, Disp: 3 each, Rfl: 3   Continuous Blood Gluc Transmit (DEXCOM G6 TRANSMITTER) MISC, USE AS DIRECTED TO CHECK BLOOD GLUCOSE FOUR TIMES DAILY, Disp: 1 each, Rfl: 3   enalapril (VASOTEC) 10 MG tablet, TAKE 1 TABLET BY MOUTH EVERY DAY, Disp: 90 tablet, Rfl: 0   Insulin Disposable Pump (OMNIPOD DASH PDM, GEN 4,) KIT, CHANGE POD EVERY 72 HOURS, Disp: , Rfl:    Insulin Disposable Pump (OMNIPOD DASH PODS, GEN 4,) MISC, Change pod every 72 hours., Disp: 10 each, Rfl: 1   Multiple Vitamin (MULTIVITAMIN) capsule, Take 1 capsule by mouth daily., Disp: , Rfl:    simvastatin (ZOCOR) 40 MG tablet, TAKE 1 TABLET BY MOUTH EVERYDAY AT BEDTIME, Disp: 30 tablet, Rfl: 5   BD PEN NEEDLE NANO U/F 32G X 4 MM MISC, USE 1 NEEDLE WITH INSULIN PEN 4 TIMES A DAY (Patient not taking: Reported on 02/25/2022), Disp: 100 each, Rfl: 2   insulin lispro (HUMALOG) 100 UNIT/ML injection, USE VIA  OMNIPOD DASH AS DIRECTED , UP TO 70 UNITS DAILY, Disp: 60 mL, Rfl: 1   ONETOUCH VERIO test strip, USE TO TEST BLOOD SUGAR 4 TIMES A DAY, Disp: 100 strip, Rfl: 8  Diabetes She presents for her follow-up diabetic visit. She has type 2 diabetes mellitus. Onset time: She was diagnosed at approximate age of 72 years. Her disease course has been stable. There are no hypoglycemic associated symptoms. Pertinent negatives for hypoglycemia include no headaches or pallor. There are no diabetic associated symptoms. Pertinent negatives for diabetes include no chest pain, no foot ulcerations, no polyphagia and no weight loss. There are no hypoglycemic complications. Symptoms are stable. There are no diabetic complications. Risk factors for coronary artery disease include diabetes mellitus, dyslipidemia, hypertension and sedentary lifestyle. Current diabetic treatment includes insulin pump. She is compliant with treatment most of the time. Her weight is fluctuating minimally. She is following a generally unhealthy diet. When asked about meal planning, she reported none. She has had a previous visit with a dietitian. She participates in exercise intermittently. Her home blood glucose trend is fluctuating minimally. Her overall blood glucose range is 180-200 mg/dl. (She presents today with her CGM and pump showing slightly above target glycemic profile.  Her POCT A1c  today is 7.7%, increasing from last visit of 7.4%.  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  Analysis of her CGM shows TIR 50%, TAR 48%, TBR <2% with a GMI of 7.6%.) An ACE inhibitor/angiotensin II receptor blocker is being taken. She does not see a podiatrist.Eye exam is current.  Hypertension This is a chronic problem. The current episode started more than 1 year ago. The problem has been gradually improving since onset. The problem is controlled. Pertinent negatives include no chest  pain, headaches, palpitations or shortness of breath. There are no associated agents to hypertension. Risk factors for coronary artery disease include dyslipidemia, diabetes mellitus, sedentary lifestyle, family history and stress. Past treatments include ACE inhibitors. The current treatment provides moderate improvement. There are no compliance problems.   Hyperlipidemia This is a chronic problem. The current episode started more than 1 year ago. The problem is uncontrolled. Recent lipid tests were reviewed and are high. Exacerbating diseases include diabetes. Factors aggravating her hyperlipidemia include fatty foods. Pertinent negatives include no chest pain or shortness of breath. Current antihyperlipidemic treatment includes statins. The current treatment provides moderate improvement of lipids. Compliance problems include adherence to diet and adherence to exercise.  Risk factors for coronary artery disease include diabetes mellitus, dyslipidemia, hypertension, a sedentary lifestyle and stress.    Review of systems  Constitutional: + Minimally fluctuating body weight,  current Body mass index is 27.5 kg/m. , no fatigue, no subjective hyperthermia, no subjective hypothermia Eyes: no blurry vision, no xerophthalmia ENT: no sore throat, no nodules palpated in throat, no dysphagia/odynophagia, no hoarseness Cardiovascular: no chest pain, no shortness of breath, no palpitations, no leg swelling Respiratory: no cough, no shortness of breath Gastrointestinal: no nausea/vomiting/diarrhea Musculoskeletal: no muscle/joint aches Skin: no rashes, no hyperemia Neurological: no tremors, no numbness, no tingling, no dizziness Psychiatric: no depression, no anxiety     Objective:    BP 139/79 (BP Location: Left Arm, Patient Position: Sitting, Cuff Size: Large)   Pulse 66   Ht '5\' 7"'$  (1.702 m)   Wt 175 lb 9.6 oz (79.7 kg)   BMI 27.50 kg/m   Wt Readings from Last 3 Encounters:  06/27/22 175 lb 9.6  oz (79.7 kg)  02/25/22 172 lb (78 kg)  10/23/21 177 lb (80.3 kg)    BP Readings from Last 3 Encounters:  06/27/22 139/79  02/25/22 133/72  10/23/21 110/69     Physical Exam- Limited  Constitutional:  Body mass index is 27.5 kg/m. , not in acute distress, normal state of mind Eyes:  EOMI, no exophthalmos Musculoskeletal: no gross deformities, strength intact in all four extremities, no gross restriction of joint movements Skin:  no rashes, no hyperemia Neurological: no tremor with outstretched hands  Diabetic Foot Exam - Simple   No data filed     Results for orders placed or performed in visit on 06/27/22  HgB A1c  Result Value Ref Range   Hemoglobin A1C 7.7 (A) 4.0 - 5.6 %   HbA1c POC (<> result, manual entry)     HbA1c, POC (prediabetic range)     HbA1c, POC (controlled diabetic range)    POCT UA - Microalbumin  Result Value Ref Range   Microalbumin Ur, POC '30mg'$ /L mg/L   Creatinine, POC '200mg'$ /dL mg/dL   Albumin/Creatinine Ratio, Urine, POC '30mg'$ /G    Complete Blood Count (Most recent): Lab Results  Component Value Date   WBC 11.0 (H)  05/12/2008   HGB 13.1 05/12/2008   HCT 38.8 05/12/2008   MCV 94.3 05/12/2008   PLT 287 05/12/2008    Lipid Panel     Component Value Date/Time   CHOL 234 (H) 07/13/2021 1009   TRIG 51 07/13/2021 1009   HDL 81 07/13/2021 1009   CHOLHDL 2.9 07/13/2021 1009   LDLCALC 145 (H) 07/13/2021 1009       Assessment & Plan:   1) Uncontrolled type 2 DM with long-term insulin use:  She presents today with her CGM and pump showing slightly above target glycemic profile.  Her POCT A1c today is 7.7%, increasing from last visit of 7.4%.  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  Analysis of her CGM shows TIR 50%, TAR 48%, TBR <2% with a GMI of 7.6%.  - Nutritional counseling repeated at each appointment due to patients tendency to fall back in to old  habits.  - The patient admits there is a room for improvement in their diet and drink choices. -  Suggestion is made for the patient to avoid simple carbohydrates from their diet including Cakes, Sweet Desserts / Pastries, Ice Cream, Soda (diet and regular), Sweet Tea, Candies, Chips, Cookies, Sweet Pastries, Store Bought Juices, Alcohol in Excess of 1-2 drinks a day, Artificial Sweeteners, Coffee Creamer, and "Sugar-free" Products. This will help patient to have stable blood glucose profile and potentially avoid unintended weight gain.   - I encouraged the patient to switch to unprocessed or minimally processed complex starch and increased protein intake (animal or plant source), fruits, and vegetables.   - Patient is advised to stick to a routine mealtimes to eat 3 meals a day and avoid unnecessary snacks (to snack only to correct hypoglycemia).  -She is advised to continue her current Omnipod pump settings but is advised to bolus more consistently before meals to prevent postprandial hyperglycemia.  I also encouraged her to cut out snacking while at work.  -She is encouraged to monitor blood glucose at least 4 times per day using her CGM, before meals and at bedtime and report to the clinic if blood glucose readings are less than 70 or greater than 300 for 3 tests in a row.  -Target numbers for a1c, LDL, HDL, Triglycerides, were discussed in detail.   2) HTN:  Her blood pressure is controlled to target.  She is advised to continue Enalapril 10 mg po daily.  3) Hyperlipidemia:  Her most recent lipid panel from 07/13/21 shows uncontrolled LDL of 145 (worsening).  She is advised to continue Simvastatin 40 mg po daily at bedtime.  Side effects and precautions discussed with her.  She is also advised to avoid fried foods and butter.  Will recheck lipid panel prior to next visit.  4) Chronic care management: -Patient is on ACEI and Statin medications and encouraged to continue to follow up with  Ophthalmology and podiatry at least yearly or according to recommendations, and advised to stay away from smoking.  -Patient is advised to continue f/u with her PMD for primary care needs.    I spent 38 minutes in the care of the patient today including review of labs from [place], Lipids, Thyroid Function, Hematology (current and previous including abstractions from other facilities); face-to-face time discussing  her blood glucose readings/logs, discussing hypoglycemia and hyperglycemia episodes and symptoms, medications doses, her options of short and long term treatment based on the latest standards  of care / guidelines;  discussion about incorporating lifestyle medicine;  and documenting the encounter. Risk reduction counseling performed per USPSTF guidelines to reduce obesity and cardiovascular risk factors.     Please refer to Patient Instructions for Blood Glucose Monitoring and Insulin/Medications Dosing Guide"  in media tab for additional information. Please  also refer to " Patient Self Inventory" in the Media  tab for reviewed elements of pertinent patient history.  [name] participated in the discussions, expressed understanding, and voiced agreement with the above plans.  All questions were answered to her satisfaction. she is encouraged to contact clinic should she have any questions or concerns prior to her return visit.   Follow up plan: Return in about 4 months (around 10/26/2022) for Diabetes F/U with A1c in office, Previsit labs, Bring meter and logs.   Rayetta Pigg, [facility] [address] [place], [place] [address] Phone: [phone] Fax: [phone]

## 2022-07-15 ENCOUNTER — Other Ambulatory Visit: Payer: Self-pay | Admitting: Nurse Practitioner

## 2022-07-15 ENCOUNTER — Telehealth: Payer: Self-pay | Admitting: Nurse Practitioner

## 2022-07-15 DIAGNOSIS — E119 Type 2 diabetes mellitus without complications: Secondary | ICD-10-CM

## 2022-07-15 MED ORDER — INSULIN ASPART 100 UNIT/ML IJ SOLN: INTRAMUSCULAR | 1 refills | Status: DC

## 2022-07-15 NOTE — Telephone Encounter (Signed)
Rx for novolog sent in to replace humalog.

## 2022-07-15 NOTE — Telephone Encounter (Signed)
Novolog, Fiasp, Apidra- any of these alternatives will work.  Just needs to be on preferred drug list for her insurance.

## 2022-07-15 NOTE — Telephone Encounter (Signed)
Pt states the pharmacy is out of stock of her Humalog injection. Asking for a alternative. Please Advise

## 2022-07-16 MED ORDER — INSULIN LISPRO 100 UNIT/ML IJ SOLN: INTRAMUSCULAR | 2 refills | Status: DC

## 2022-08-15 ENCOUNTER — Other Ambulatory Visit: Payer: Self-pay | Admitting: Nurse Practitioner

## 2022-08-27 ENCOUNTER — Other Ambulatory Visit: Payer: Self-pay | Admitting: Nurse Practitioner

## 2022-09-11 ENCOUNTER — Other Ambulatory Visit: Payer: Self-pay | Admitting: Nurse Practitioner

## 2022-10-11 ENCOUNTER — Other Ambulatory Visit: Payer: Self-pay | Admitting: Nurse Practitioner

## 2022-10-17 ENCOUNTER — Other Ambulatory Visit: Payer: Self-pay | Admitting: Nurse Practitioner

## 2022-10-28 ENCOUNTER — Ambulatory Visit: Payer: 59 | Admitting: Nurse Practitioner

## 2022-11-15 LAB — COMPREHENSIVE METABOLIC PANEL
ALT: 23 IU/L (ref 0–32)
AST: 24 IU/L (ref 0–40)
Albumin/Globulin Ratio: 1.6 (ref 1.2–2.2)
Albumin: 4.3 g/dL (ref 3.8–4.9)
Alkaline Phosphatase: 103 IU/L (ref 44–121)
BUN/Creatinine Ratio: 19 (ref 12–28)
BUN: 13 mg/dL (ref 8–27)
Bilirubin Total: 0.4 mg/dL (ref 0.0–1.2)
CO2: 25 mmol/L (ref 20–29)
Calcium: 9 mg/dL (ref 8.7–10.3)
Chloride: 103 mmol/L (ref 96–106)
Creatinine, Ser: 0.7 mg/dL (ref 0.57–1.00)
Globulin, Total: 2.7 g/dL (ref 1.5–4.5)
Glucose: 74 mg/dL (ref 70–99)
Potassium: 4.6 mmol/L (ref 3.5–5.2)
Sodium: 141 mmol/L (ref 134–144)
Total Protein: 7 g/dL (ref 6.0–8.5)
eGFR: 99 mL/min/{1.73_m2} (ref >59)

## 2022-11-15 LAB — TSH: TSH: 0.849 u[IU]/mL (ref 0.450–4.500)

## 2022-11-15 LAB — LIPID PANEL
Chol/HDL Ratio: 2.5 ratio (ref 0.0–4.4)
Cholesterol, Total: 194 mg/dL (ref 100–199)
HDL: 79 mg/dL (ref >39)
LDL Chol Calc (NIH): 107 mg/dL — ABNORMAL HIGH (ref 0–99)
Triglycerides: 40 mg/dL (ref 0–149)
VLDL Cholesterol Cal: 8 mg/dL (ref 5–40)

## 2022-11-15 LAB — VITAMIN D 25 HYDROXY (VIT D DEFICIENCY, FRACTURES): Vit D, 25-Hydroxy: 37.9 ng/mL (ref 30.0–100.0)

## 2022-11-15 LAB — T4, FREE: Free T4: 1.38 ng/dL (ref 0.82–1.77)

## 2022-11-20 ENCOUNTER — Encounter: Payer: Self-pay | Admitting: Nurse Practitioner

## 2022-11-20 ENCOUNTER — Ambulatory Visit: Payer: 59 | Admitting: Nurse Practitioner

## 2022-11-20 VITALS — BP 133/84 | HR 64 | Ht 67.0 in | Wt 180.2 lb

## 2022-11-20 DIAGNOSIS — Z794 Long term (current) use of insulin: Secondary | ICD-10-CM | POA: Diagnosis not present

## 2022-11-20 DIAGNOSIS — E559 Vitamin D deficiency, unspecified: Secondary | ICD-10-CM | POA: Diagnosis not present

## 2022-11-20 DIAGNOSIS — I10 Essential (primary) hypertension: Secondary | ICD-10-CM | POA: Diagnosis not present

## 2022-11-20 DIAGNOSIS — E119 Type 2 diabetes mellitus without complications: Secondary | ICD-10-CM

## 2022-11-20 DIAGNOSIS — E782 Mixed hyperlipidemia: Secondary | ICD-10-CM

## 2022-11-20 LAB — POCT GLYCOSYLATED HEMOGLOBIN (HGB A1C): Hemoglobin A1C: 7.5 % — AB (ref 4.0–5.6)

## 2022-11-20 MED ORDER — ONETOUCH ULTRA TEST VI STRP: ORAL_STRIP | 12 refills | Status: AC

## 2022-11-20 MED ORDER — SIMVASTATIN 40 MG PO TABS: 40.0000 mg | ORAL_TABLET | Freq: Every day | ORAL | 3 refills | Status: DC

## 2022-11-20 NOTE — Progress Notes (Signed)
11/20/2022   Endocrinology follow-up note      Subjective:    Patient ID: Vicki Ward, female    DOB: [birth year], 62 y.o.   MRN: [id number]   Vicki Ward is a 62 y.o. female presenting on 11/20/2022 for follow up of currently uncontrolled diabetes, hypertension, and hyperlipidemia.  Past Medical History:  Diagnosis Date   Diabetes (HCC)    Hypercholesteremia    Family History  Problem Relation Age of Onset   Rectal cancer Sister        age at onset 49, recently diagnosed    Diabetes type I Mother    Diabetes Mother    Hypertension Father    Diabetes Sister    Diabetes Brother     Past Surgical History:  Procedure Laterality Date   APPENDECTOMY     COLONOSCOPY N/A 10/01/2012   Procedure: COLONOSCOPY;  Surgeon: Corbin Ade, MD;  Location: [facility];  Service: Endoscopy;  Laterality: N/A;  11:00   DILATION AND CURETTAGE OF UTERUS      Current Outpatient Medications:    aspirin 81 MG tablet, Take 81 mg by mouth daily., Disp: , Rfl:    Continuous Blood Gluc Sensor (DEXCOM G6 SENSOR) MISC, USE AS DIRECTED, Disp: 3 each, Rfl: 3   Continuous Glucose Transmitter (DEXCOM G6 TRANSMITTER) MISC, USE AS DIRECTED TO CHECK BLOOD GLUCOSE FOUR TIMES DAILY, Disp: 1 each, Rfl: 1   enalapril (VASOTEC) 10 MG tablet, TAKE 1 TABLET BY MOUTH EVERY DAY, Disp: 30 tablet, Rfl: 2   glucose blood (ONETOUCH ULTRA TEST) test strip, Use as instructed to monitor glucose 4 times daily, Disp: 100 each, Rfl: 12   Insulin Disposable Pump (OMNIPOD DASH PDM, GEN 4,) KIT, CHANGE POD EVERY 72 HOURS, Disp: , Rfl:    Insulin Disposable Pump (OMNIPOD DASH PODS, GEN 4,) MISC, Change pod every 72 hours., Disp: 10 each, Rfl: 1   insulin lispro (HUMALOG) 100 UNIT/ML injection, USE VIA OMNIPOD DASH AS DIRECTED, UP TO 70 UNITS DAILY,, Disp: 20 mL, Rfl: 2   Multiple Vitamin (MULTIVITAMIN) capsule, Take 1 capsule by mouth daily., Disp: , Rfl:    simvastatin (ZOCOR) 40 MG tablet, Take 1 tablet (40 mg total)  by mouth daily at 6 PM., Disp: 90 tablet, Rfl: 3  Diabetes She presents for her follow-up diabetic visit. She has type 2 diabetes mellitus. Onset time: She was diagnosed at approximate age of 46 years. Her disease course has been stable. There are no hypoglycemic associated symptoms. Pertinent negatives for hypoglycemia include no headaches or pallor. There are no diabetic associated symptoms. Pertinent negatives for diabetes include no chest pain, no foot ulcerations, no polyphagia and no weight loss. There are no hypoglycemic complications. Symptoms are stable. There are no diabetic complications. Risk factors for coronary artery disease include diabetes mellitus, dyslipidemia, hypertension and sedentary lifestyle. Current diabetic treatment includes insulin pump. She is compliant with treatment most of the time. Her weight is fluctuating minimally. She is following a generally unhealthy diet. When asked about meal planning, she reported none. She has had a previous visit with a dietitian. She participates in exercise intermittently. Her home blood glucose trend is fluctuating minimally. Her overall blood glucose range is 140-180 mg/dl. (She presents today with her CGM and pump showing fluctuating glycemic profile.  Her POCT A1c today is 7.5%, improving from last visit of 7.7%.  She notes she tends to overcorrect for low glucose readings.  She is wondering if her CGM is incorrect sometimes  for the low alarms as she does not feel low (but does not have strips for her meter to check).  Analysis of her CGM shows TIR 52%, TAR 46%, TBR 2% with a GMI of 7.6%.) An ACE inhibitor/angiotensin II receptor blocker is being taken. She does not see a podiatrist.Eye exam is current.  Hypertension This is a chronic problem. The current episode started more than 1 year ago. The problem has been gradually improving since onset. The problem is controlled. Pertinent negatives include no chest pain, headaches, palpitations or  shortness of breath. There are no associated agents to hypertension. Risk factors for coronary artery disease include dyslipidemia, diabetes mellitus, sedentary lifestyle, family history and stress. Past treatments include ACE inhibitors. The current treatment provides moderate improvement. There are no compliance problems.   Hyperlipidemia This is a chronic problem. The current episode started more than 1 year ago. The problem is uncontrolled. Recent lipid tests were reviewed and are high. Exacerbating diseases include diabetes. Factors aggravating her hyperlipidemia include fatty foods. Pertinent negatives include no chest pain or shortness of breath. Current antihyperlipidemic treatment includes statins. The current treatment provides moderate improvement of lipids. Compliance problems include adherence to diet and adherence to exercise.  Risk factors for coronary artery disease include diabetes mellitus, dyslipidemia, hypertension, a sedentary lifestyle and stress.    Review of systems  Constitutional: + steadily increasing body weight,  current Body mass index is 28.22 kg/m. , no fatigue, no subjective hyperthermia, no subjective hypothermia Eyes: no blurry vision, no xerophthalmia ENT: no sore throat, no nodules palpated in throat, no dysphagia/odynophagia, no hoarseness Cardiovascular: no chest pain, no shortness of breath, no palpitations, no leg swelling Respiratory: no cough, no shortness of breath Gastrointestinal: no nausea/vomiting/diarrhea Musculoskeletal: no muscle/joint aches Skin: no rashes, no hyperemia Neurological: no tremors, no numbness, no tingling, no dizziness Psychiatric: no depression, no anxiety     Objective:    BP 133/84 (BP Location: Left Arm, Patient Position: Sitting, Cuff Size: Large)   Pulse 64   Ht 5\' 7"  (1.702 m)   Wt 180 lb 3.2 oz (81.7 kg)   BMI 28.22 kg/m   Wt Readings from Last 3 Encounters:  11/20/22 180 lb 3.2 oz (81.7 kg)  06/27/22 175 lb 9.6  oz (79.7 kg)  02/25/22 172 lb (78 kg)    BP Readings from Last 3 Encounters:  11/20/22 133/84  06/27/22 139/79  02/25/22 133/72     Physical Exam- Limited  Constitutional:  Body mass index is 28.22 kg/m. , not in acute distress, normal state of mind Eyes:  EOMI, no exophthalmos Musculoskeletal: no gross deformities, strength intact in all four extremities, no gross restriction of joint movements Skin:  no rashes, no hyperemia Neurological: no tremor with outstretched hands  Diabetic Foot Exam - Simple   No data filed     Results for orders placed or performed in visit on 11/20/22  HgB A1c  Result Value Ref Range   Hemoglobin A1C 7.5 (A) 4.0 - 5.6 %   HbA1c POC (<> result, manual entry)     HbA1c, POC (prediabetic range)     HbA1c, POC (controlled diabetic range)     Complete Blood Count (Most recent): Lab Results  Component Value Date   WBC 11.0 (H) 05/12/2008   HGB 13.1 05/12/2008   HCT 38.8 05/12/2008   MCV 94.3 05/12/2008   PLT 287 05/12/2008    Lipid Panel     Component Value Date/Time   CHOL 194 11/14/2022 0809  TRIG 40 11/14/2022 0809   HDL 79 11/14/2022 0809   CHOLHDL 2.5 11/14/2022 0809   LDLCALC 107 (H) 11/14/2022 0809       Assessment & Plan:   1) Uncontrolled type 2 DM with long-term insulin use:  She presents today with her CGM and pump showing fluctuating glycemic profile.  Her POCT A1c today is 7.5%, improving from last visit of 7.7%.  She notes she tends to overcorrect for low glucose readings.  She is wondering if her CGM is incorrect sometimes for the low alarms as she does not feel low (but does not have strips for her meter to check).  Analysis of her CGM shows TIR 52%, TAR 46%, TBR 2% with a GMI of 7.6%.  - Nutritional counseling repeated at each appointment due to patients tendency to fall back in to old habits.  - The patient admits there is a room for improvement in their diet and drink choices. -  Suggestion is made for the  patient to avoid simple carbohydrates from their diet including Cakes, Sweet Desserts / Pastries, Ice Cream, Soda (diet and regular), Sweet Tea, Candies, Chips, Cookies, Sweet Pastries, Store Bought Juices, Alcohol in Excess of 1-2 drinks a day, Artificial Sweeteners, Coffee Creamer, and "Sugar-free" Products. This will help patient to have stable blood glucose profile and potentially avoid unintended weight gain.   - I encouraged the patient to switch to unprocessed or minimally processed complex starch and increased protein intake (animal or plant source), fruits, and vegetables.   - Patient is advised to stick to a routine mealtimes to eat 3 meals a day and avoid unnecessary snacks (to snack only to correct hypoglycemia).  -I did adjust her Omnipod DASH basal rate to 1 unit/hr to prevent hypoglycemia.   -She is encouraged to monitor blood glucose at least 4 times per day using her CGM, before meals and at bedtime and report to the clinic if blood glucose readings are less than 70 or greater than 300 for 3 tests in a row.  -Target numbers for a1c, LDL, HDL, Triglycerides, were discussed in detail.   2) HTN:  Her blood pressure is controlled to target.  She is advised to continue Enalapril 10 mg po daily.  3) Hyperlipidemia:  Her most recent lipid panel from 11/14/22 shows uncontrolled LDL of 107 (improving).  She had run out of her Simvastatin and never refilled it.  She is advised to restart Simvastatin 40 mg po daily at bedtime.  Side effects and precautions discussed with her.  She is also advised to avoid fried foods and butter.    4) Chronic care management: -Patient is on ACEI and Statin medications and encouraged to continue to follow up with Ophthalmology and podiatry at least yearly or according to recommendations, and advised to stay away from smoking.  -Patient is advised to continue f/u with her PMD for primary care needs.     I spent  46  minutes in the care of the patient  today including review of labs from CMP, Lipids, Thyroid Function, Hematology (current and previous including abstractions from other facilities); face-to-face time discussing  her blood glucose readings/logs, discussing hypoglycemia and hyperglycemia episodes and symptoms, medications doses, her options of short and long term treatment based on the latest standards of care / guidelines;  discussion about incorporating lifestyle medicine;  and documenting the encounter. Risk reduction counseling performed per USPSTF guidelines to reduce obesity and cardiovascular risk factors.     Please refer to Patient  Instructions for Blood Glucose Monitoring and Insulin/Medications Dosing Guide"  in media tab for additional information. Please  also refer to " Patient Self Inventory" in the Media  tab for reviewed elements of pertinent patient history.  Vicki Ward participated in the discussions, expressed understanding, and voiced agreement with the above plans.  All questions were answered to her satisfaction. she is encouraged to contact clinic should she have any questions or concerns prior to her return visit.   Follow up plan: Return in about 4 months (around 03/22/2023) for Diabetes F/U with A1c in office, No previsit labs, Bring meter and logs.   Ronny Bacon, [facility] [address] [place], [place] [address] Phone: [phone] Fax: [phone]

## 2022-12-11 ENCOUNTER — Other Ambulatory Visit: Payer: Self-pay | Admitting: Nurse Practitioner

## 2022-12-24 ENCOUNTER — Other Ambulatory Visit: Payer: Self-pay | Admitting: Nurse Practitioner

## 2023-02-05 ENCOUNTER — Other Ambulatory Visit: Payer: Self-pay | Admitting: Nurse Practitioner

## 2023-02-16 ENCOUNTER — Other Ambulatory Visit: Payer: Self-pay | Admitting: Nurse Practitioner

## 2023-03-02 ENCOUNTER — Other Ambulatory Visit: Payer: Self-pay | Admitting: Nurse Practitioner

## 2023-03-25 ENCOUNTER — Ambulatory Visit: Payer: 59 | Admitting: Nurse Practitioner

## 2023-03-25 ENCOUNTER — Other Ambulatory Visit: Payer: Self-pay

## 2023-03-25 MED ORDER — OMNIPOD DASH PODS (GEN 4) MISC: 1 refills | Status: DC

## 2023-03-26 ENCOUNTER — Other Ambulatory Visit: Payer: Self-pay | Admitting: Nurse Practitioner

## 2023-10-28 ENCOUNTER — Encounter: Payer: Self-pay | Admitting: Nurse Practitioner

## 2023-10-28 ENCOUNTER — Ambulatory Visit: Admitting: Nurse Practitioner

## 2023-10-28 VITALS — BP 112/62 | HR 60 | Ht 67.0 in | Wt 183.4 lb

## 2023-10-28 DIAGNOSIS — Z794 Long term (current) use of insulin: Secondary | ICD-10-CM | POA: Diagnosis not present

## 2023-10-28 DIAGNOSIS — I10 Essential (primary) hypertension: Secondary | ICD-10-CM | POA: Diagnosis not present

## 2023-10-28 DIAGNOSIS — E559 Vitamin D deficiency, unspecified: Secondary | ICD-10-CM | POA: Diagnosis not present

## 2023-10-28 DIAGNOSIS — E119 Type 2 diabetes mellitus without complications: Secondary | ICD-10-CM

## 2023-10-28 DIAGNOSIS — E782 Mixed hyperlipidemia: Secondary | ICD-10-CM

## 2023-10-28 LAB — POCT GLYCOSYLATED HEMOGLOBIN (HGB A1C): Hemoglobin A1C: 8.4 % — AB (ref 4.0–5.6)

## 2023-10-28 MED ORDER — DEXCOM G7 SENSOR MISC: 1.0000 | 3 refills | Status: DC

## 2023-10-28 MED ORDER — DEXCOM G7 RECEIVER DEVI: 1.0000 | Freq: Once | 0 refills | Status: AC

## 2023-10-28 MED ORDER — ENALAPRIL MALEATE 10 MG PO TABS: 10.0000 mg | ORAL_TABLET | Freq: Every day | ORAL | 3 refills | Status: AC

## 2023-10-28 MED ORDER — INSULIN LISPRO 100 UNIT/ML IJ SOLN: INTRAMUSCULAR | 6 refills | Status: DC

## 2023-10-28 MED ORDER — SIMVASTATIN 40 MG PO TABS: 40.0000 mg | ORAL_TABLET | Freq: Every day | ORAL | 3 refills | Status: DC

## 2023-10-28 MED ORDER — OMNIPOD DASH PODS (GEN 4) MISC: 1 refills | Status: DC

## 2023-10-28 NOTE — Progress Notes (Signed)
 10/28/2023   Endocrinology follow-up note      Subjective:    Patient ID: Vicki Ward, female    DOB: 10/17/1960, 63 y.o.   MRN: [id number]   Vicki Ward is a 63 y.o. female presenting on 10/28/2023 for follow up of currently uncontrolled diabetes, hypertension, and hyperlipidemia.  Past Medical History:  Diagnosis Date   Diabetes (HCC)    Hypercholesteremia    Family History  Problem Relation Age of Onset   Rectal cancer Sister        age at onset 64, recently diagnosed    Diabetes type I Mother    Diabetes Mother    Hypertension Father    Diabetes Sister    Diabetes Brother     Past Surgical History:  Procedure Laterality Date   APPENDECTOMY     COLONOSCOPY N/A 10/01/2012   Procedure: COLONOSCOPY;  Surgeon: Suzette Espy, MD;  Location: [facility];  Service: Endoscopy;  Laterality: N/A;  11:00   DILATION AND CURETTAGE OF UTERUS      Current Outpatient Medications:    aspirin 81 MG tablet, Take 81 mg by mouth daily., Disp: , Rfl:    Continuous Glucose Receiver (DEXCOM G7 RECEIVER) DEVI, 1 Device by Does not apply route once for 1 dose., Disp: 1 each, Rfl: 0   Continuous Glucose Sensor (DEXCOM G7 SENSOR) MISC, Inject 1 Application into the skin as directed. Change sensor every 10 days as directed., Disp: 9 each, Rfl: 3   glucose blood (ONETOUCH ULTRA TEST) test strip, Use as instructed to monitor glucose 4 times daily, Disp: 100 each, Rfl: 12   Insulin  Disposable Pump (OMNIPOD DASH PDM, GEN 4,) KIT, CHANGE POD EVERY 72 HOURS, Disp: , Rfl:    Insulin  Disposable Pump (OMNIPOD DASH PODS, GEN 4,) MISC, Change pod every 72 hours., Disp: 10 each, Rfl: 1   Multiple Vitamin (MULTIVITAMIN) capsule, Take 1 capsule by mouth daily., Disp: , Rfl:    enalapril  (VASOTEC ) 10 MG tablet, Take 1 tablet (10 mg total) by mouth daily., Disp: 90 tablet, Rfl: 3   Insulin  Disposable Pump (OMNIPOD DASH PODS, GEN 4,) MISC, Change pod every 72 hrs, Disp: 30 each, Rfl: 1   insulin   lispro (HUMALOG ) 100 UNIT/ML injection, USE VIA OMNIPOD DASH AS DIRECTED, UP TO 80 UNITS DAILY,, Disp: 60 mL, Rfl: 6   simvastatin  (ZOCOR ) 40 MG tablet, Take 1 tablet (40 mg total) by mouth daily at 6 PM., Disp: 90 tablet, Rfl: 3  Diabetes She presents for her follow-up diabetic visit. She has type 2 diabetes mellitus. Onset time: She was diagnosed at approximate age of 46 years. Her disease course has been worsening. There are no hypoglycemic associated symptoms. Pertinent negatives for hypoglycemia include no headaches or pallor. There are no diabetic associated symptoms. Pertinent negatives for diabetes include no chest pain, no foot ulcerations, no polyphagia and no weight loss. There are no hypoglycemic complications. Symptoms are stable. There are no diabetic complications. Risk factors for coronary artery disease include diabetes mellitus, dyslipidemia, hypertension and sedentary lifestyle. Current diabetic treatment includes insulin  pump. She is compliant with treatment most of the time. Her weight is fluctuating minimally. She is following a generally unhealthy diet. When asked about meal planning, she reported none. She has had a previous visit with a dietitian. She participates in exercise intermittently. Her home blood glucose trend is increasing steadily. Her overall blood glucose range is >200 mg/dl. (She presents today after long absence with her CGM and pump  showing above target glycemic profile overall.  Her POCT A1c today is 8.4%, increasing from last visit of 7.5%.  She notes she could work on her diet more, has started eating more than what she should.  She denies any significant hypoglycemia.  Analysis of her CGM shows TIR 37%, TAR 63%, TBR 0% with a GMi of 8.2%.) An ACE inhibitor/angiotensin II receptor blocker is being taken. She does not see a podiatrist.Eye exam is current.  Hypertension This is a chronic problem. The current episode started more than 1 year ago. The problem has been  gradually improving since onset. The problem is controlled. Pertinent negatives include no chest pain, headaches, palpitations or shortness of breath. There are no associated agents to hypertension. Risk factors for coronary artery disease include dyslipidemia, diabetes mellitus, sedentary lifestyle, family history and stress. Past treatments include ACE inhibitors. The current treatment provides moderate improvement. There are no compliance problems.   Hyperlipidemia This is a chronic problem. The current episode started more than 1 year ago. The problem is uncontrolled. Recent lipid tests were reviewed and are high. Exacerbating diseases include diabetes. Factors aggravating her hyperlipidemia include fatty foods. Pertinent negatives include no chest pain or shortness of breath. Current antihyperlipidemic treatment includes statins. The current treatment provides moderate improvement of lipids. Compliance problems include adherence to diet and adherence to exercise.  Risk factors for coronary artery disease include diabetes mellitus, dyslipidemia, hypertension, a sedentary lifestyle and stress.    Review of systems  Constitutional: + increasing body weight,  current Body mass index is 28.72 kg/m. , no fatigue, no subjective hyperthermia, no subjective hypothermia Eyes: no blurry vision, no xerophthalmia ENT: no sore throat, no nodules palpated in throat, no dysphagia/odynophagia, no hoarseness Cardiovascular: no chest pain, no shortness of breath, no palpitations, no leg swelling Respiratory: no cough, no shortness of breath Gastrointestinal: no nausea/vomiting/diarrhea Musculoskeletal: no muscle/joint aches Skin: no rashes, no hyperemia Neurological: no tremors, no numbness, no tingling, no dizziness Psychiatric: no depression, no anxiety     Objective:    BP 112/62 (BP Location: Left Arm, Patient Position: Sitting, Cuff Size: Large)   Pulse 60   Ht 5\' 7"  (1.702 m)   Wt 183 lb 6.4 oz  (83.2 kg)   BMI 28.72 kg/m   Wt Readings from Last 3 Encounters:  10/28/23 183 lb 6.4 oz (83.2 kg)  11/20/22 180 lb 3.2 oz (81.7 kg)  06/27/22 175 lb 9.6 oz (79.7 kg)    BP Readings from Last 3 Encounters:  10/28/23 112/62  11/20/22 133/84  06/27/22 139/79     Physical Exam- Limited  Constitutional:  Body mass index is 28.72 kg/m. , not in acute distress, normal state of mind Eyes:  EOMI, no exophthalmos Musculoskeletal: no gross deformities, strength intact in all four extremities, no gross restriction of joint movements Skin:  no rashes, no hyperemia Neurological: no tremor with outstretched hands  Diabetic Foot Exam - Simple   Simple Foot Form Diabetic Foot exam was performed with the following findings: Yes 10/28/2023  8:29 AM  Visual Inspection See comments: Yes Sensation Testing Intact to touch and monofilament testing bilaterally: Yes Pulse Check Posterior Tibialis and Dorsalis pulse intact bilaterally: Yes Comments Crowding of toes on left foot, mild onychomycosis bilaterally     Results for orders placed or performed in visit on 10/28/23  HgB A1c   Collection Time: 10/28/23  8:19 AM  Result Value Ref Range   Hemoglobin A1C 8.4 (A) 4.0 - 5.6 %   HbA1c  POC (<> result, manual entry)     HbA1c, POC (prediabetic range)     HbA1c, POC (controlled diabetic range)     Complete Blood Count (Most recent): Lab Results  Component Value Date   WBC 11.0 (H) 05/12/2008   HGB 13.1 05/12/2008   HCT 38.8 05/12/2008   MCV 94.3 05/12/2008   PLT 287 05/12/2008    Lipid Panel     Component Value Date/Time   CHOL 194 11/14/2022 0809   TRIG 40 11/14/2022 0809   HDL 79 11/14/2022 0809   CHOLHDL 2.5 11/14/2022 0809   LDLCALC 107 (H) 11/14/2022 0809       Assessment & Plan:   1) Uncontrolled type 2 DM with long-term insulin  use:  She presents today after long absence with her CGM and pump showing above target glycemic profile overall.  Her POCT A1c today is 8.4%,  increasing from last visit of 7.5%.  She notes she could work on her diet more, has started eating more than what she should.  She denies any significant hypoglycemia.  Analysis of her CGM shows TIR 37%, TAR 63%, TBR 0% with a GMi of 8.2%.  - Nutritional counseling repeated at each appointment due to patients tendency to fall back in to old habits.  - The patient admits there is a room for improvement in their diet and drink choices. -  Suggestion is made for the patient to avoid simple carbohydrates from their diet including Cakes, Sweet Desserts / Pastries, Ice Cream, Soda (diet and regular), Sweet Tea, Candies, Chips, Cookies, Sweet Pastries, Store Bought Juices, Alcohol in Excess of 1-2 drinks a day, Artificial Sweeteners, Coffee Creamer, and "Sugar-free" Products. This will help patient to have stable blood glucose profile and potentially avoid unintended weight gain.   - I encouraged the patient to switch to unprocessed or minimally processed complex starch and increased protein intake (animal or plant source), fruits, and vegetables.   - Patient is advised to stick to a routine mealtimes to eat 3 meals a day and avoid unnecessary snacks (to snack only to correct hypoglycemia).  -I did adjust her Omnipod DASH basal rate to 1.15 unit/hr given recent hyperglycemia.   -She is encouraged to monitor blood glucose at least 4 times per day using her CGM, before meals and at bedtime and report to the clinic if blood glucose readings are less than 70 or greater than 300 for 3 tests in a row.  -Target numbers for a1c, LDL, HDL, Triglycerides, were discussed in detail.   2) HTN:  Her blood pressure is controlled to target.  She is advised to continue Enalapril  10 mg po daily.  3) Hyperlipidemia:  Her most recent lipid panel from 11/14/22 shows uncontrolled LDL of 107 (improving).  She had run out of her Simvastatin  and never refilled it.  She is advised to restart Simvastatin  40 mg po daily at bedtime.   Side effects and precautions discussed with her.  She is also advised to avoid fried foods and butter.  Will recheck lipid panel prior to next visit.  4) Chronic care management: -Patient is on ACEI and Statin medications and encouraged to continue to follow up with Ophthalmology and podiatry at least yearly or according to recommendations, and advised to stay away from smoking.  -Patient is advised to continue f/u with her PMD for primary care needs.     I spent  46  minutes in the care of the patient today including review of labs from CMP, Lipids,  Thyroid  Function, Hematology (current and previous including abstractions from other facilities); face-to-face time discussing  her blood glucose readings/logs, discussing hypoglycemia and hyperglycemia episodes and symptoms, medications doses, her options of short and long term treatment based on the latest standards of care / guidelines;  discussion about incorporating lifestyle medicine;  and documenting the encounter. Risk reduction counseling performed per USPSTF guidelines to reduce obesity and cardiovascular risk factors.     Please refer to Patient Instructions for Blood Glucose Monitoring and Insulin /Medications Dosing Guide"  in media tab for additional information. Please  also refer to " Patient Self Inventory" in the Media  tab for reviewed elements of pertinent patient history.  Clarinda Crome participated in the discussions, expressed understanding, and voiced agreement with the above plans.  All questions were answered to her satisfaction. she is encouraged to contact clinic should she have any questions or concerns prior to her return visit.   Follow up plan: Return in about 4 months (around 02/28/2024) for Diabetes F/U with A1c in office, Previsit labs, Bring meter and logs.   Hulon Magic, [facility] [address] [place], [place] [address] Phone: [phone] Fax: [phone]

## 2023-11-04 ENCOUNTER — Telehealth: Payer: Self-pay | Admitting: *Deleted

## 2023-11-04 NOTE — Telephone Encounter (Signed)
 Patient left a message that her insurance has denied her Dexcom G7 and the Omnipod. The Humalog  , she was told that this insulin  was on a back order. Patient is asking what is she to do next? [phone].

## 2023-11-05 NOTE — Telephone Encounter (Signed)
 Noted

## 2023-11-05 NOTE — Telephone Encounter (Signed)
 Novolog  and Humalog  are the only insulins FDA approved for use in pumps.  Ill attach the PA team here to see if they can shed some light and help us  out with reasons for denial?

## 2023-11-06 ENCOUNTER — Other Ambulatory Visit (HOSPITAL_COMMUNITY): Payer: Self-pay

## 2023-11-06 ENCOUNTER — Telehealth: Payer: Self-pay

## 2023-11-06 NOTE — Telephone Encounter (Signed)
 Pharmacy Patient Advocate Encounter   Received notification from Pt Calls Messages that prior authorization for Dexcom, and others is required/requested.   Insurance verification completed.   The patient is insured through [employer] .   Per test claim: Pt not covered. I have a feeling that some of these may be covered but there is an issues with her insurance    I called the number provided, and the rep said she was unable to find coverage as well. She gave me another number to call but that seemed to be incorrect as the call disconnected upon transfer and would not connected when I tried to call back.   I was able to find another number [phone]) as the pharmacy helpdesk number. They were able to verify that her insurance was correct. They gave me another number to call [phone]) to find out why the claims are rejecting.  This rep said the grp# provided was incorrect and should be: [id number]  He claims that none of her diabetic supplies are covered and prior authorizations would not make a difference. Upon further investigation, it looks like this may only be a discount card which is why there is no formulary no PAs can be done.

## 2023-12-05 NOTE — Telephone Encounter (Signed)
 Talked with the patient several weeks ago, she states that the wrong number from insurance is being  used. Waiting to hear back.

## 2024-02-24 LAB — COMPREHENSIVE METABOLIC PANEL WITH GFR
ALT: 23 IU/L (ref 0–32)
AST: 21 IU/L (ref 0–40)
Albumin: 4.2 g/dL (ref 3.9–4.9)
Alkaline Phosphatase: 92 IU/L (ref 51–125)
BUN/Creatinine Ratio: 19 (ref 12–28)
BUN: 14 mg/dL (ref 8–27)
Bilirubin Total: 0.5 mg/dL (ref 0.0–1.2)
CO2: 22 mmol/L (ref 20–29)
Calcium: 9.2 mg/dL (ref 8.7–10.3)
Chloride: 105 mmol/L (ref 96–106)
Creatinine, Ser: 0.74 mg/dL (ref 0.57–1.00)
Globulin, Total: 3 g/dL (ref 1.5–4.5)
Glucose: 155 mg/dL — ABNORMAL HIGH (ref 70–99)
Potassium: 4.7 mmol/L (ref 3.5–5.2)
Sodium: 139 mmol/L (ref 134–144)
Total Protein: 7.2 g/dL (ref 6.0–8.5)
eGFR: 92 mL/min/1.73 (ref >59)

## 2024-02-24 LAB — LIPID PANEL
Chol/HDL Ratio: 3.1 ratio (ref 0.0–4.4)
Cholesterol, Total: 226 mg/dL — ABNORMAL HIGH (ref 100–199)
HDL: 74 mg/dL (ref >39)
LDL Chol Calc (NIH): 141 mg/dL — ABNORMAL HIGH (ref 0–99)
Triglycerides: 65 mg/dL (ref 0–149)
VLDL Cholesterol Cal: 11 mg/dL (ref 5–40)

## 2024-02-24 LAB — VITAMIN D 25 HYDROXY (VIT D DEFICIENCY, FRACTURES): Vit D, 25-Hydroxy: 32.3 ng/mL (ref 30.0–100.0)

## 2024-02-24 LAB — T4, FREE: Free T4: 1.12 ng/dL (ref 0.82–1.77)

## 2024-02-24 LAB — TSH: TSH: 0.669 u[IU]/mL (ref 0.450–4.500)

## 2024-02-24 LAB — MICROALBUMIN / CREATININE URINE RATIO
Creatinine, Urine: 156.9 mg/dL
Microalb/Creat Ratio: 11 mg/g{creat} (ref 0–29)
Microalbumin, Urine: 18 ug/mL

## 2024-03-01 ENCOUNTER — Ambulatory Visit: Payer: PRIVATE HEALTH INSURANCE | Admitting: Nurse Practitioner

## 2024-03-01 ENCOUNTER — Encounter: Payer: Self-pay | Admitting: Nurse Practitioner

## 2024-03-01 VITALS — BP 118/62 | HR 69 | Ht 67.0 in | Wt 189.8 lb

## 2024-03-01 DIAGNOSIS — E782 Mixed hyperlipidemia: Secondary | ICD-10-CM

## 2024-03-01 DIAGNOSIS — E119 Type 2 diabetes mellitus without complications: Secondary | ICD-10-CM

## 2024-03-01 DIAGNOSIS — Z794 Long term (current) use of insulin: Secondary | ICD-10-CM

## 2024-03-01 DIAGNOSIS — I10 Essential (primary) hypertension: Secondary | ICD-10-CM

## 2024-03-01 DIAGNOSIS — E559 Vitamin D deficiency, unspecified: Secondary | ICD-10-CM

## 2024-03-01 MED ORDER — SIMVASTATIN 80 MG PO TABS: 80.0000 mg | ORAL_TABLET | Freq: Every day | ORAL | 1 refills | Status: AC

## 2024-03-01 MED ORDER — INSULIN LISPRO (1 UNIT DIAL) 100 UNIT/ML (KWIKPEN): 8.0000 [IU] | PEN_INJECTOR | Freq: Three times a day (TID) | SUBCUTANEOUS | 3 refills | Status: AC

## 2024-03-01 MED ORDER — FREESTYLE LIBRE 3 PLUS SENSOR MISC: 3 refills | Status: AC

## 2024-03-01 MED ORDER — LANTUS SOLOSTAR 100 UNIT/ML ~~LOC~~ SOPN: 35.0000 [IU] | PEN_INJECTOR | Freq: Every day | SUBCUTANEOUS | 3 refills | Status: AC

## 2024-03-01 MED ORDER — PEN NEEDLES 31G X 6 MM MISC: 3 refills | Status: DC

## 2024-03-01 NOTE — Progress Notes (Signed)
 03/01/2024   Endocrinology follow-up note      Subjective:    Patient ID: Vicki Ward, female    DOB: 14-Feb-1961, 63 y.o.   MRN: [id number]   ENZLEY KITCHENS is a 63 y.o. female presenting on 03/01/2024 for follow up of currently uncontrolled diabetes, hypertension, and hyperlipidemia.  Past Medical History:  Diagnosis Date   Diabetes (HCC)    Hypercholesteremia    Family History  Problem Relation Age of Onset   Rectal cancer Sister        age at onset 31, recently diagnosed    Diabetes type I Mother    Diabetes Mother    Hypertension Father    Diabetes Sister    Diabetes Brother     Past Surgical History:  Procedure Laterality Date   APPENDECTOMY     COLONOSCOPY N/A 10/01/2012   Procedure: COLONOSCOPY;  Surgeon: Lamar CHRISTELLA Hollingshead, MD;  Location: [facility];  Service: Endoscopy;  Laterality: N/A;  11:00   DILATION AND CURETTAGE OF UTERUS      Current Outpatient Medications:    aspirin 81 MG tablet, Take 81 mg by mouth daily., Disp: , Rfl:    Continuous Glucose Sensor (FREESTYLE LIBRE 3 PLUS SENSOR) MISC, Change sensor every 15 days., Disp: 6 each, Rfl: 3   enalapril  (VASOTEC ) 10 MG tablet, Take 1 tablet (10 mg total) by mouth daily., Disp: 90 tablet, Rfl: 3   glucose blood (ONETOUCH ULTRA TEST) test strip, Use as instructed to monitor glucose 4 times daily, Disp: 100 each, Rfl: 12   insulin  glargine (LANTUS  SOLOSTAR) 100 UNIT/ML Solostar Pen, Inject 35 Units into the skin at bedtime., Disp: 36 mL, Rfl: 3   insulin  lispro (HUMALOG  KWIKPEN) 100 UNIT/ML KwikPen, Inject 8-14 Units into the skin 3 (three) times daily., Disp: 45 mL, Rfl: 3   Insulin  Pen Needle (PEN NEEDLES) 31G X 6 MM MISC, Use to inject insulin  4 times daily, Disp: 300 each, Rfl: 3   Multiple Vitamin (MULTIVITAMIN) capsule, Take 1 capsule by mouth daily., Disp: , Rfl:    simvastatin  (ZOCOR ) 80 MG tablet, Take 1 tablet (80 mg total) by mouth daily at 6 PM., Disp: 90 tablet, Rfl: 1  Diabetes She  presents for her follow-up diabetic visit. She has type 2 diabetes mellitus. Onset time: She was diagnosed at approximate age of 46 years. There are no hypoglycemic associated symptoms. Pertinent negatives for hypoglycemia include no pallor. There are no diabetic associated symptoms. Pertinent negatives for diabetes include no foot ulcerations, no polyphagia and no weight loss. There are no hypoglycemic complications. Symptoms are stable. There are no diabetic complications. Risk factors for coronary artery disease include diabetes mellitus, dyslipidemia, hypertension and sedentary lifestyle. Current diabetic treatment includes insulin  pump. She is compliant with treatment most of the time. Her weight is increasing steadily. She is following a generally unhealthy diet. When asked about meal planning, she reported none. She has had a previous visit with a dietitian. She participates in exercise intermittently. Her home blood glucose trend is fluctuating dramatically. (She presents today with her meter, no logs, showing fluctuating glycemic profile.  She has not been using Omnipod DASH or CGM due to insurance coverage issues.  She has been using Lantus  and lispro in the meantime.  She does have some mild hypoglycemia at times.  Her A1c was not able to be performed in the office today due to equipment issue.) An ACE inhibitor/angiotensin II receptor blocker is being taken. She does not see a  podiatrist.Eye exam is current.    Review of systems  Constitutional: + increasing body weight,  current Body mass index is 29.73 kg/m. , no fatigue, no subjective hyperthermia, no subjective hypothermia Eyes: no blurry vision, no xerophthalmia ENT: no sore throat, no nodules palpated in throat, no dysphagia/odynophagia, no hoarseness Cardiovascular: no chest pain, no shortness of breath, no palpitations, no leg swelling Respiratory: no cough, no shortness of breath Gastrointestinal: no  nausea/vomiting/diarrhea Musculoskeletal: no muscle/joint aches Skin: no rashes, no hyperemia Neurological: no tremors, no numbness, no tingling, no dizziness Psychiatric: no depression, no anxiety     Objective:    BP 118/62 (BP Location: Left Arm, Patient Position: Sitting, Cuff Size: Large)   Pulse 69   Ht 5' 7 (1.702 m)   Wt 189 lb 12.8 oz (86.1 kg)   BMI 29.73 kg/m   Wt Readings from Last 3 Encounters:  03/01/24 189 lb 12.8 oz (86.1 kg)  10/28/23 183 lb 6.4 oz (83.2 kg)  11/20/22 180 lb 3.2 oz (81.7 kg)    BP Readings from Last 3 Encounters:  03/01/24 118/62  10/28/23 112/62  11/20/22 133/84    Physical Exam- Limited  Constitutional:  Body mass index is 29.73 kg/m. , not in acute distress, normal state of mind Eyes:  EOMI, no exophthalmos Musculoskeletal: no gross deformities, strength intact in all four extremities, no gross restriction of joint movements Skin:  no rashes, no hyperemia Neurological: no tremor with outstretched hands  Diabetic Foot Exam - Simple   No data filed     Results for orders placed or performed in visit on 10/28/23  HgB A1c   Collection Time: 10/28/23  8:19 AM  Result Value Ref Range   Hemoglobin A1C 8.4 (A) 4.0 - 5.6 %   HbA1c POC (<> result, manual entry)     HbA1c, POC (prediabetic range)     HbA1c, POC (controlled diabetic range)    Comprehensive metabolic panel with GFR   Collection Time: 02/23/24 11:11 AM  Result Value Ref Range   Glucose 155 (H) 70 - 99 mg/dL   BUN 14 8 - 27 mg/dL   Creatinine, Ser 9.25 0.57 - 1.00 mg/dL   eGFR 92 >40 fO/fpw/8.26   BUN/Creatinine Ratio 19 12 - 28   Sodium 139 134 - 144 mmol/L   Potassium 4.7 3.5 - 5.2 mmol/L   Chloride 105 96 - 106 mmol/L   CO2 22 20 - 29 mmol/L   Calcium 9.2 8.7 - 10.3 mg/dL   Total Protein 7.2 6.0 - 8.5 g/dL   Albumin 4.2 3.9 - 4.9 g/dL   Globulin, Total 3.0 1.5 - 4.5 g/dL   Bilirubin Total 0.5 0.0 - 1.2 mg/dL   Alkaline Phosphatase 92 51 - 125 IU/L   AST 21 0  - 40 IU/L   ALT 23 0 - 32 IU/L  Lipid panel   Collection Time: 02/23/24 11:11 AM  Result Value Ref Range   Cholesterol, Total 226 (H) 100 - 199 mg/dL   Triglycerides 65 0 - 149 mg/dL   HDL 74 >60 mg/dL   VLDL Cholesterol Cal 11 5 - 40 mg/dL   LDL Chol Calc (NIH) 858 (H) 0 - 99 mg/dL   Chol/HDL Ratio 3.1 0.0 - 4.4 ratio  TSH   Collection Time: 02/23/24 11:11 AM  Result Value Ref Range   TSH 0.669 0.450 - 4.500 uIU/mL  T4, free   Collection Time: 02/23/24 11:11 AM  Result Value Ref Range   Free T4 1.12  0.82 - 1.77 ng/dL  VITAMIN D  25 Hydroxy (Vit-D Deficiency, Fractures)   Collection Time: 02/23/24 11:11 AM  Result Value Ref Range   Vit D, 25-Hydroxy 32.3 30.0 - 100.0 ng/mL  Microalbumin / creatinine urine ratio   Collection Time: 02/23/24 11:11 AM  Result Value Ref Range   Creatinine, Urine 156.9 Not Estab. mg/dL   Microalbumin, Urine 81.9 Not Estab. ug/mL   Microalb/Creat Ratio 11 0 - 29 mg/g creat   Complete Blood Count (Most recent): Lab Results  Component Value Date   WBC 11.0 (H) 05/12/2008   HGB 13.1 05/12/2008   HCT 38.8 05/12/2008   MCV 94.3 05/12/2008   PLT 287 05/12/2008    Lipid Panel     Component Value Date/Time   CHOL 226 (H) 02/23/2024 1111   TRIG 65 02/23/2024 1111   HDL 74 02/23/2024 1111   CHOLHDL 3.1 02/23/2024 1111   LDLCALC 141 (H) 02/23/2024 1111       Assessment & Plan:   1) Uncontrolled type 2 DM with long-term insulin  use:  She presents today with her meter, no logs, showing fluctuating glycemic profile.  She has not been using Omnipod DASH or CGM due to insurance coverage issues.  She has been using Lantus  and lispro in the meantime.  She does have some mild hypoglycemia at times.  Her A1c was not able to be performed in the office today due to equipment issue.  - Nutritional counseling repeated at each appointment due to patients tendency to fall back in to old habits.  - The patient admits there is a room for improvement in their  diet and drink choices. -  Suggestion is made for the patient to avoid simple carbohydrates from their diet including Cakes, Sweet Desserts / Pastries, Ice Cream, Soda (diet and regular), Sweet Tea, Candies, Chips, Cookies, Sweet Pastries, Store Bought Juices, Alcohol in Excess of 1-2 drinks a day, Artificial Sweeteners, Coffee Creamer, and Sugar-free Products. This will help patient to have stable blood glucose profile and potentially avoid unintended weight gain.   - I encouraged the patient to switch to unprocessed or minimally processed complex starch and increased protein intake (animal or plant source), fruits, and vegetables.   - Patient is advised to stick to a routine mealtimes to eat 3 meals a day and avoid unnecessary snacks (to snack only to correct hypoglycemia).  -She is advised to continue Lantus  35 units SQ nightly and adjust her Humalog  to 8-14 units SQ TID with meals if glucose is above 90 and she is eating (Specific instructions on how to titrate insulin  dosage based on glucose readings given to patient in writing).  -She is encouraged to monitor blood glucose at least 4 times per day, before meals and at bedtime and report to the clinic if blood glucose readings are less than 70 or greater than 300 for 3 tests in a row.  Dexcom was too expensive, thus will try sending in [place] 3 plus for her.  -Target numbers for a1c, LDL, HDL, Triglycerides, were discussed in detail.   2) HTN:  Her blood pressure is controlled to target.  She is advised to continue Enalapril  10 mg po daily.  3) Hyperlipidemia:  Her most recent lipid panel from 02/23/24 shows uncontrolled LDL of 141- worsening.  She is advised to increase Simvastatin  to 80 mg po daily at bedtime (may take 2 of her 40 mg tabs until she depletes her current supply).  Side effects and precautions discussed with her.  She is also advised to avoid fried foods and butter.    4) Chronic care management: -Patient is on ACEI and  Statin medications and encouraged to continue to follow up with Ophthalmology and podiatry at least yearly or according to recommendations, and advised to stay away from smoking.  -Patient is advised to continue f/u with her PMD for primary care needs.      I spent  41  minutes in the care of the patient today including review of labs from CMP, Lipids, Thyroid  Function, Hematology (current and previous including abstractions from other facilities); face-to-face time discussing  her blood glucose readings/logs, discussing hypoglycemia and hyperglycemia episodes and symptoms, medications doses, her options of short and long term treatment based on the latest standards of care / guidelines;  discussion about incorporating lifestyle medicine;  and documenting the encounter. Risk reduction counseling performed per USPSTF guidelines to reduce obesity and cardiovascular risk factors.     Please refer to Patient Instructions for Blood Glucose Monitoring and Insulin /Medications Dosing Guide  in media tab for additional information. Please  also refer to  Patient Self Inventory in the Media  tab for reviewed elements of pertinent patient history.  Gwenlyn JAYSON Novak participated in the discussions, expressed understanding, and voiced agreement with the above plans.  All questions were answered to her satisfaction. she is encouraged to contact clinic should she have any questions or concerns prior to her return visit.   Follow up plan: Return in about 3 months (around 05/31/2024) for Diabetes F/U with A1c in office, No previsit labs, Bring meter and logs.   Benton Rio, [facility] [address] [place], [place] [address] Phone: [phone] Fax: [phone]

## 2024-04-09 ENCOUNTER — Ambulatory Visit: Payer: Self-pay

## 2024-04-09 VITALS — BP 130/82 | HR 83 | Ht 67.0 in | Wt 192.1 lb

## 2024-04-09 DIAGNOSIS — Z1211 Encounter for screening for malignant neoplasm of colon: Secondary | ICD-10-CM

## 2024-04-09 DIAGNOSIS — I10 Essential (primary) hypertension: Secondary | ICD-10-CM

## 2024-04-09 DIAGNOSIS — E782 Mixed hyperlipidemia: Secondary | ICD-10-CM

## 2024-04-09 DIAGNOSIS — Z7689 Persons encountering health services in other specified circumstances: Secondary | ICD-10-CM

## 2024-04-09 DIAGNOSIS — Z1231 Encounter for screening mammogram for malignant neoplasm of breast: Secondary | ICD-10-CM

## 2024-04-09 DIAGNOSIS — E119 Type 2 diabetes mellitus without complications: Secondary | ICD-10-CM | POA: Insufficient documentation

## 2024-04-09 NOTE — Progress Notes (Signed)
 New Patient Office Visit  Subjective    Patient ID: Vicki Ward, female    DOB: August 28, 1960  Age: 63 y.o. MRN: [id number]  CC:  Chief Complaint  Patient presents with   Establish Care    Establish care    HPI Vicki Ward presents to establish care from [facility] family medicine.  She is a former patient of Dr. Bertell.  She is a 63 year old female with history of type 2 diabetes treated with insulin , hypertension and hyperlipidemia.  She has been under the care of endocrinologist, Dr. Lenis and Benton Rio, NP for her diabetes.  She denies any new issues or concerns at this time.  Denies needing refills at this time.  Outpatient Encounter Medications as of 04/09/2024  Medication Sig   aspirin 81 MG tablet Take 81 mg by mouth daily.   Continuous Glucose Sensor (FREESTYLE LIBRE 3 PLUS SENSOR) MISC Change sensor every 15 days.   enalapril  (VASOTEC ) 10 MG tablet Take 1 tablet (10 mg total) by mouth daily.   glucose blood (ONETOUCH ULTRA TEST) test strip Use as instructed to monitor glucose 4 times daily   insulin  glargine (LANTUS  SOLOSTAR) 100 UNIT/ML Solostar Pen Inject 35 Units into the skin at bedtime.   insulin  lispro (HUMALOG  KWIKPEN) 100 UNIT/ML KwikPen Inject 8-14 Units into the skin 3 (three) times daily.   Multiple Vitamin (MULTIVITAMIN) capsule Take 1 capsule by mouth daily.   simvastatin  (ZOCOR ) 80 MG tablet Take 1 tablet (80 mg total) by mouth daily at 6 PM.   [DISCONTINUED] Insulin  Pen Needle (PEN NEEDLES) 31G X 6 MM MISC Use to inject insulin  4 times daily   No facility-administered encounter medications on file as of 04/09/2024.    Past Medical History:  Diagnosis Date   Controlled type 2 diabetes mellitus without complication (HCC) 04/09/2024   Diabetes (HCC)    Family history of colon cancer requiring screening colonoscopy 09/08/2012   Hypercholesteremia     Past Surgical History:  Procedure Laterality Date   APPENDECTOMY     COLONOSCOPY N/A 10/01/2012    Procedure: COLONOSCOPY;  Surgeon: Lamar CHRISTELLA Hollingshead, MD;  [facility];  Service: Endoscopy;  Laterality: N/A;  11:00   DILATION AND CURETTAGE OF UTERUS      Family History  Problem Relation Age of Onset   Diabetes type I Mother    Diabetes Mother    Hypertension Father    Rectal cancer Sister        onset age 31   Diabetes Sister    Diabetes Brother     Social History   Socioeconomic History   Marital status: Divorced    Spouse name: Not on file   Number of children: 1   Years of education: Not on file   Highest education level: Associate degree: occupational, scientist, product/process development, or vocational program  Occupational History   Occupation: Garment/textile Technologist Department    Employer: [employer]    Comment: detention    Employer: [employer]  Tobacco Use   Smoking status: Never   Smokeless tobacco: Never  Vaping Use   Vaping status: Never Used  Substance and Sexual Activity   Alcohol use: Yes    Comment: socially   Drug use: No   Sexual activity: Not on file  Other Topics Concern   Not on file  Social History Narrative   Not on file   Social Drivers of Health   Financial Resource Strain: Low Risk  (04/07/2024)  Overall Financial Resource Strain (CARDIA)    Difficulty of Paying Living Expenses: Not hard at all  Food Insecurity: No Food Insecurity (04/07/2024)   Hunger Vital Sign    Worried About Running Out of Food in the Last Year: Never true    Ran Out of Food in the Last Year: Never true  Transportation Needs: No Transportation Needs (04/07/2024)   PRAPARE - Administrator, Civil Service (Medical): No    Lack of Transportation (Non-Medical): No  Physical Activity: Insufficiently Active (04/07/2024)   Exercise Vital Sign    Days of Exercise per Week: 2 days    Minutes of Exercise per Session: 30 min  Stress: No Stress Concern Present (04/07/2024)   [employer] of Occupational Health - Occupational Stress  Questionnaire    Feeling of Stress: Not at all  Social Connections: Moderately Isolated (04/07/2024)   Social Connection and Isolation Panel    Frequency of Communication with Friends and Family: Three times a week    Frequency of Social Gatherings with Friends and Family: Once a week    Attends Religious Services: More than 4 times per year    Active Member of [employer] or Organizations: No    Attends Engineer, Structural: Not on file    Marital Status: Divorced  Catering Manager Violence: Not on file    ROS     Objective    BP 130/82 (BP Location: Left Arm, Patient Position: Sitting, Cuff Size: Normal)   Pulse 83   Ht 5' 7 (1.702 m)   Wt 192 lb 1.3 oz (87.1 kg)   SpO2 94%   BMI 30.08 kg/m   Physical Exam Vitals and nursing note reviewed.  Constitutional:      Appearance: Normal appearance.  HENT:     Head: Normocephalic.     Right Ear: Tympanic membrane, ear canal and external ear normal.     Left Ear: Tympanic membrane, ear canal and external ear normal.     Nose: Nose normal.     Mouth/Throat:     Mouth: Mucous membranes are moist.     Pharynx: Oropharynx is clear.  Eyes:     Extraocular Movements: Extraocular movements intact.     Pupils: Pupils are equal, round, and reactive to light.  Cardiovascular:     Rate and Rhythm: Normal rate and regular rhythm.  Pulmonary:     Effort: Pulmonary effort is normal.     Breath sounds: Normal breath sounds.  Abdominal:     General: Bowel sounds are normal.     Palpations: Abdomen is soft.     Tenderness: There is no right CVA tenderness or left CVA tenderness.  Musculoskeletal:     Cervical back: Normal range of motion and neck supple.  Skin:    General: Skin is warm and dry.  Neurological:     Mental Status: She is alert and oriented to person, place, and time.  Psychiatric:        Mood and Affect: Mood normal.        Thought Content: Thought content normal.     Last metabolic panel Lab Results   Component Value Date   GLUCOSE 155 (H) 02/23/2024   NA 139 02/23/2024   K 4.7 02/23/2024   CL 105 02/23/2024   CO2 22 02/23/2024   BUN 14 02/23/2024   CREATININE 0.74 02/23/2024   EGFR 92 02/23/2024   CALCIUM 9.2 02/23/2024   PHOS 4.6 (H) 05/08/2017   PROT 7.2  02/23/2024   ALBUMIN 4.2 02/23/2024   LABGLOB 3.0 02/23/2024   AGRATIO 1.6 11/14/2022   BILITOT 0.5 02/23/2024   ALKPHOS 92 02/23/2024   AST 21 02/23/2024   ALT 23 02/23/2024   Last lipids Lab Results  Component Value Date   CHOL 226 (H) 02/23/2024   HDL 74 02/23/2024   LDLCALC 141 (H) 02/23/2024   TRIG 65 02/23/2024   CHOLHDL 3.1 02/23/2024   Last hemoglobin A1c Lab Results  Component Value Date   HGBA1C 8.4 (A) 10/28/2023   Last thyroid  functions Lab Results  Component Value Date   TSH 0.669 02/23/2024   FREET4 1.12 02/23/2024   Last vitamin D  Lab Results  Component Value Date   VD25OH 32.3 02/23/2024      Assessment & Plan:   Problem List Items Addressed This Visit       Cardiovascular and Mediastinum   Essential hypertension - Primary   Currently stable with enalapril  10 mg.  No medication changes made today.  Continue with current dose.        Other   Mixed hyperlipidemia   Currently managed with simvastatin  80 mg.  No changes made today.  Continue with current medication.  Will update fasting labs at future office visit.      Other Visit Diagnoses       Encounter to establish care         Encounter for screening mammogram for malignant neoplasm of breast       Last mammogram was 2016.  Screening mammogram ordered.   Relevant Orders   MM Digital Screening     Screening for colon cancer       Obtain Cologuard for colon cancer screening.   Relevant Orders   Cologuard       No follow-ups on file.   Leita Longs, FNP

## 2024-04-18 NOTE — Assessment & Plan Note (Signed)
 Currently managed with simvastatin  80 mg.  No changes made today.  Continue with current medication.  Will update fasting labs at future office visit.

## 2024-04-18 NOTE — Assessment & Plan Note (Signed)
 Currently stable with enalapril  10 mg.  No medication changes made today.  Continue with current dose.

## 2024-04-20 LAB — COLOGUARD

## 2024-04-21 ENCOUNTER — Ambulatory Visit: Payer: Self-pay

## 2024-04-28 ENCOUNTER — Other Ambulatory Visit: Payer: Self-pay

## 2024-04-28 ENCOUNTER — Ambulatory Visit (HOSPITAL_COMMUNITY)
Admission: RE | Admit: 2024-04-28 | Discharge: 2024-04-28 | Disposition: A | Discharge status: Home / Self Care | Payer: PRIVATE HEALTH INSURANCE | Source: Ambulatory Visit

## 2024-04-28 DIAGNOSIS — Z1211 Encounter for screening for malignant neoplasm of colon: Secondary | ICD-10-CM

## 2024-04-28 DIAGNOSIS — Z1231 Encounter for screening mammogram for malignant neoplasm of breast: Secondary | ICD-10-CM | POA: Diagnosis present

## 2024-04-28 DIAGNOSIS — I10 Essential (primary) hypertension: Secondary | ICD-10-CM

## 2024-04-28 DIAGNOSIS — E782 Mixed hyperlipidemia: Secondary | ICD-10-CM

## 2024-04-28 DIAGNOSIS — Z7689 Persons encountering health services in other specified circumstances: Secondary | ICD-10-CM

## 2024-05-02 LAB — COLOGUARD: COLOGUARD: NEGATIVE

## 2024-06-07 ENCOUNTER — Encounter: Payer: Self-pay | Admitting: Nurse Practitioner

## 2024-06-07 ENCOUNTER — Ambulatory Visit (INDEPENDENT_AMBULATORY_CARE_PROVIDER_SITE_OTHER): Payer: PRIVATE HEALTH INSURANCE | Admitting: Nurse Practitioner

## 2024-06-07 VITALS — BP 118/70 | HR 74 | Ht 67.0 in | Wt 194.0 lb

## 2024-06-07 DIAGNOSIS — Z794 Long term (current) use of insulin: Secondary | ICD-10-CM | POA: Diagnosis not present

## 2024-06-07 DIAGNOSIS — I10 Essential (primary) hypertension: Secondary | ICD-10-CM

## 2024-06-07 DIAGNOSIS — E782 Mixed hyperlipidemia: Secondary | ICD-10-CM

## 2024-06-07 DIAGNOSIS — E119 Type 2 diabetes mellitus without complications: Secondary | ICD-10-CM | POA: Diagnosis not present

## 2024-06-07 DIAGNOSIS — E559 Vitamin D deficiency, unspecified: Secondary | ICD-10-CM | POA: Diagnosis not present

## 2024-06-07 LAB — POCT GLYCOSYLATED HEMOGLOBIN (HGB A1C): Hemoglobin A1C: 8.7 % — AB (ref 4.0–5.6)

## 2024-06-07 NOTE — Progress Notes (Signed)
 " 06/07/2024   Endocrinology follow-up note      Subjective:    Patient ID: Vicki Ward, female    DOB: 02/27/1961, 63 y.o.   MRN: [id number]   Vicki Ward is a 64 y.o. female presenting on 06/07/2024 for follow up of currently uncontrolled diabetes, hypertension, and hyperlipidemia.  Past Medical History:  Diagnosis Date   Controlled type 2 diabetes mellitus without complication (HCC) 04/09/2024   Diabetes (HCC)    Family history of colon cancer requiring screening colonoscopy 09/08/2012   Hypercholesteremia    Family History  Problem Relation Age of Onset   Diabetes type I Mother    Diabetes Mother    Hypertension Father    Rectal cancer Sister        onset age 75   Diabetes Sister    Diabetes Brother     Past Surgical History:  Procedure Laterality Date   APPENDECTOMY     COLONOSCOPY N/A 10/01/2012   Procedure: COLONOSCOPY;  Surgeon: Lamar CHRISTELLA Hollingshead, MD;  Location: [facility];  Service: Endoscopy;  Laterality: N/A;  11:00   DILATION AND CURETTAGE OF UTERUS      Current Outpatient Medications:    aspirin 81 MG tablet, Take 81 mg by mouth daily., Disp: , Rfl:    enalapril  (VASOTEC ) 10 MG tablet, Take 1 tablet (10 mg total) by mouth daily., Disp: 90 tablet, Rfl: 3   glucose blood (ONETOUCH ULTRA TEST) test strip, Use as instructed to monitor glucose 4 times daily, Disp: 100 each, Rfl: 12   insulin  glargine (LANTUS  SOLOSTAR) 100 UNIT/ML Solostar Pen, Inject 35 Units into the skin at bedtime., Disp: 36 mL, Rfl: 3   insulin  lispro (HUMALOG  KWIKPEN) 100 UNIT/ML KwikPen, Inject 8-14 Units into the skin 3 (three) times daily., Disp: 45 mL, Rfl: 3   Multiple Vitamin (MULTIVITAMIN) capsule, Take 1 capsule by mouth daily., Disp: , Rfl:    simvastatin  (ZOCOR ) 80 MG tablet, Take 1 tablet (80 mg total) by mouth daily at 6 PM., Disp: 90 tablet, Rfl: 1   Continuous Glucose Sensor (FREESTYLE LIBRE 3 PLUS SENSOR) MISC, Change sensor every 15 days. (Patient not taking:  Reported on 06/07/2024), Disp: 6 each, Rfl: 3  Diabetes She presents for her follow-up diabetic visit. She has type 2 diabetes mellitus. Onset time: She was diagnosed at approximate age of 46 years. Her disease course has been fluctuating. There are no hypoglycemic associated symptoms. Pertinent negatives for hypoglycemia include no pallor. There are no diabetic associated symptoms. Pertinent negatives for diabetes include no foot ulcerations, no polyphagia and no weight loss. There are no hypoglycemic complications. Symptoms are stable. There are no diabetic complications. Risk factors for coronary artery disease include diabetes mellitus, dyslipidemia, hypertension and sedentary lifestyle. Current diabetic treatment includes insulin  pump. She is compliant with treatment most of the time. Her weight is increasing steadily. She is following a generally unhealthy diet. When asked about meal planning, she reported none. She has had a previous visit with a dietitian. She participates in exercise intermittently. Her home blood glucose trend is fluctuating dramatically. (She presents today with her logs, no meter or CGM, showing fluctuating glycemic profile.   Her POCT A1c today is 8.7%, increasing slightly from last visit of 8.4%.  She notes she has been having some issues with her CGM, getting an itchy rash with this and also noting some wild differences in glucose readings when comparing CGM with fingerstick.  She denies being sick between visits, no recent steroid  use.  ) An ACE inhibitor/angiotensin II receptor blocker is being taken. She does not see a podiatrist.Eye exam is current.    Review of systems  Constitutional: + increasing body weight,  current Body mass index is 30.38 kg/m. , no fatigue, no subjective hyperthermia, no subjective hypothermia Eyes: no blurry vision, no xerophthalmia ENT: no sore throat, no nodules palpated in throat, no dysphagia/odynophagia, no hoarseness Cardiovascular: no  chest pain, no shortness of breath, no palpitations, no leg swelling Respiratory: no cough, no shortness of breath Gastrointestinal: no nausea/vomiting/diarrhea Musculoskeletal: no muscle/joint aches Skin: no rashes, no hyperemia Neurological: no tremors, no numbness, no tingling, no dizziness Psychiatric: no depression, no anxiety     Objective:    BP 118/70 (BP Location: Right Arm, Patient Position: Sitting, Cuff Size: Large)   Pulse 74   Ht 5' 7 (1.702 m)   Wt 194 lb (88 kg)   BMI 30.38 kg/m   Wt Readings from Last 3 Encounters:  06/07/24 194 lb (88 kg)  04/09/24 192 lb 1.3 oz (87.1 kg)  03/01/24 189 lb 12.8 oz (86.1 kg)    BP Readings from Last 3 Encounters:  06/07/24 118/70  04/09/24 130/82  03/01/24 118/62     Physical Exam- Limited  Constitutional:  Body mass index is 30.38 kg/m. , not in acute distress, normal state of mind Eyes:  EOMI, no exophthalmos Musculoskeletal: no gross deformities, strength intact in all four extremities, no gross restriction of joint movements Skin:  no rashes, no hyperemia Neurological: no tremor with outstretched hands  Diabetic Foot Exam - Simple   No data filed     Results for orders placed or performed in visit on 06/07/24  HgB A1c   Collection Time: 06/07/24  8:46 AM  Result Value Ref Range   Hemoglobin A1C 8.7 (A) 4.0 - 5.6 %   HbA1c POC (<> result, manual entry)     HbA1c, POC (prediabetic range)     HbA1c, POC (controlled diabetic range)     Complete Blood Count (Most recent): Lab Results  Component Value Date   WBC 11.0 (H) 05/12/2008   HGB 13.1 05/12/2008   HCT 38.8 05/12/2008   MCV 94.3 05/12/2008   PLT 287 05/12/2008    Lipid Panel     Component Value Date/Time   CHOL 226 (H) 02/23/2024 1111   TRIG 65 02/23/2024 1111   HDL 74 02/23/2024 1111   CHOLHDL 3.1 02/23/2024 1111   LDLCALC 141 (H) 02/23/2024 1111       Assessment & Plan:   1) Uncontrolled type 2 DM with long-term insulin  use:  She  presents today with her logs, no meter or CGM, showing fluctuating glycemic profile.   Her POCT A1c today is 8.7%, increasing slightly from last visit of 8.4%.  She notes she has been having some issues with her CGM, getting an itchy rash with this and also noting some wild differences in glucose readings when comparing CGM with fingerstick.  She denies being sick between visits, no recent steroid use.    - Nutritional counseling repeated/built upon at each appointment.  - The patient admits there is a room for improvement in their diet and drink choices. -  Suggestion is made for the patient to avoid simple carbohydrates from their diet including Cakes, Sweet Desserts / Pastries, Ice Cream, Soda (diet and regular), Sweet Tea, Candies, Chips, Cookies, Sweet Pastries, Store Bought Juices, Alcohol in Excess of 1-2 drinks a day, Artificial Sweeteners, Coffee Creamer, and Sugar-free Products.  This will help patient to have stable blood glucose profile and potentially avoid unintended weight gain.   - I encouraged the patient to switch to unprocessed or minimally processed complex starch and increased protein intake (animal or plant source), fruits, and vegetables.   - Patient is advised to stick to a routine mealtimes to eat 3 meals a day and avoid unnecessary snacks (to snack only to correct hypoglycemia).  -She is advised to continue Lantus  35 units SQ nightly and Humalog  8-14 units SQ TID with meals if glucose is above 90 and she is eating (Specific instructions on how to titrate insulin  dosage based on glucose readings given to patient in writing).  -She is encouraged to monitor blood glucose at least 4 times per day, before meals and at bedtime and report to the clinic if blood glucose readings are less than 70 or greater than 300 for 3 tests in a row.  I did suggest trying Flonase on the site before applying sensor and using skin prep wipe to help prevent adhesive sensitivity.  -Target numbers for  a1c, LDL, HDL, Triglycerides, were discussed in detail.   2) HTN:  Her blood pressure is controlled to target.  She is advised to continue Enalapril  10 mg po daily.  3) Hyperlipidemia:  Her most recent lipid panel from 02/23/24 shows uncontrolled LDL of 141- worsening.  She is advised to increase Simvastatin  to 80 mg po daily at bedtime.  Side effects and precautions discussed with her.  She is also advised to avoid fried foods and butter.    4) Chronic care management: -Patient is on ACEI and Statin medications and encouraged to continue to follow up with Ophthalmology and podiatry at least yearly or according to recommendations, and advised to stay away from smoking.  -Patient is advised to continue f/u with her PMD for primary care needs.     I spent  44  minutes in the care of the patient today including review of labs from CMP, Lipids, Thyroid  Function, Hematology (current and previous including abstractions from other facilities); face-to-face time discussing  her blood glucose readings/logs, discussing hypoglycemia and hyperglycemia episodes and symptoms, medications doses, her options of short and long term treatment based on the latest standards of care / guidelines;  discussion about incorporating lifestyle medicine;  and documenting the encounter. Risk reduction counseling performed per USPSTF guidelines to reduce obesity and cardiovascular risk factors.     Please refer to Patient Instructions for Blood Glucose Monitoring and Insulin /Medications Dosing Guide  in media tab for additional information. Please  also refer to  Patient Self Inventory in the Media  tab for reviewed elements of pertinent patient history.  Gwenlyn JAYSON Novak participated in the discussions, expressed understanding, and voiced agreement with the above plans.  All questions were answered to her satisfaction. she is encouraged to contact clinic should she have any questions or concerns prior to her return  visit.   Follow up plan: Return in about 3 months (around 09/05/2024) for Diabetes F/U with A1c in office, No previsit labs, Bring meter and logs.   Benton Rio, [facility] [address] [place], [place] [address] Phone: [phone] Fax: [phone]   "

## 2024-09-06 ENCOUNTER — Ambulatory Visit: Payer: PRIVATE HEALTH INSURANCE | Admitting: Nurse Practitioner
